# Patient Record
Sex: Female | Born: 1955 | Race: White | Hispanic: No | Marital: Married | Smoking: Never smoker
Health system: Southern US, Community
[De-identification: ages and names within clinical notes are randomized; demographics above are authoritative.]

## PROBLEM LIST (undated history)

## (undated) DIAGNOSIS — G35 Multiple sclerosis: Secondary | ICD-10-CM

## (undated) DIAGNOSIS — R569 Unspecified convulsions: Secondary | ICD-10-CM

## (undated) DIAGNOSIS — B019 Varicella without complication: Secondary | ICD-10-CM

## (undated) DIAGNOSIS — L719 Rosacea, unspecified: Secondary | ICD-10-CM

## (undated) DIAGNOSIS — M8580 Other specified disorders of bone density and structure, unspecified site: Secondary | ICD-10-CM

## (undated) DIAGNOSIS — M810 Age-related osteoporosis without current pathological fracture: Secondary | ICD-10-CM

## (undated) HISTORY — DX: Unspecified convulsions: R56.9

## (undated) HISTORY — DX: Multiple sclerosis: G35

## (undated) HISTORY — DX: Varicella without complication: B01.9

## (undated) HISTORY — PX: COLONOSCOPY: SHX174

## (undated) HISTORY — DX: Rosacea, unspecified: L71.9

---

## 2018-02-21 ENCOUNTER — Encounter: Payer: Self-pay | Admitting: Primary Care

## 2018-02-21 ENCOUNTER — Ambulatory Visit (INDEPENDENT_AMBULATORY_CARE_PROVIDER_SITE_OTHER): Payer: BLUE CROSS/BLUE SHIELD | Admitting: Primary Care

## 2018-02-21 ENCOUNTER — Encounter (INDEPENDENT_AMBULATORY_CARE_PROVIDER_SITE_OTHER): Payer: Self-pay

## 2018-02-21 VITALS — BP 122/82 | HR 77 | Temp 98.1°F | Ht 63.0 in | Wt 129.0 lb

## 2018-02-21 DIAGNOSIS — G40909 Epilepsy, unspecified, not intractable, without status epilepticus: Secondary | ICD-10-CM | POA: Diagnosis not present

## 2018-02-21 DIAGNOSIS — G35 Multiple sclerosis: Secondary | ICD-10-CM | POA: Diagnosis not present

## 2018-02-21 DIAGNOSIS — L719 Rosacea, unspecified: Secondary | ICD-10-CM

## 2018-02-21 DIAGNOSIS — G35D Multiple sclerosis, unspecified: Secondary | ICD-10-CM

## 2018-02-21 NOTE — Patient Instructions (Addendum)
Please schedule a physical with me in the future at your convenience. You may also schedule a lab only appointment 3-4 days prior. We will discuss your lab results in detail during your physical.  You will be contacted regarding your referral to ophthalmology and dermatology.  Please let us know if you have not been contacted within one week.   It was a pleasure to meet you today! Please don't hesitate to call or message me with any questions. Welcome to [facility]!

## 2018-02-21 NOTE — Assessment & Plan Note (Addendum)
Diagnosed in 1997, stable on current regimen. Will be establishing with neurology in September. Referral placed to ophthalmology for annual eye evaluation.

## 2018-02-21 NOTE — Assessment & Plan Note (Signed)
Compliant to [place]. No seizure since 2015.

## 2018-02-21 NOTE — Progress Notes (Signed)
Subjective:    Patient ID: Natasha George, female    DOB: 1956-03-06, 62 y.o.   MRN: [id number]  HPI  Ms. Proudfoot is a 62 year old female who presents today to establish care and discuss the problems mentioned below. Will obtain old records. Her last physical was in April 2018.   1) Seizure Disorder: Currently managed on Keppra 500 mg. Two seizures total: 10/30/09 and 08/15/13. No seizures since. She has an appointment scheduled in September with a neurologist.   2) Multiple Sclerosis: Currently managed on Avonex Pen 30 mcg/0.5 ml. Diagnosed in February 1997. She has an appointment scheduled in September with a neurologist.   BP Readings from Last 3 Encounters:  02/21/18 122/82     Review of Systems  Eyes: Negative for visual disturbance.  Respiratory: Negative for shortness of breath.   Cardiovascular: Negative for chest pain.  Neurological: Negative for seizures, weakness and numbness.       Past Medical History:  Diagnosis Date  . Chickenpox   . Multiple sclerosis ([place])   . Seizure [facility])      Social History   Socioeconomic History  . Marital status: Married    Spouse name: Not on file  . Number of children: Not on file  . Years of education: Not on file  . Highest education level: Not on file  Occupational History  . Not on file  Social Needs  . Financial resource strain: Not on file  . Food insecurity:    Worry: Not on file    Inability: Not on file  . Transportation needs:    Medical: Not on file    Non-medical: Not on file  Tobacco Use  . Smoking status: Never Smoker  . Smokeless tobacco: Never Used  Substance and Sexual Activity  . Alcohol use: Yes  . Drug use: Not on file  . Sexual activity: Not on file  Lifestyle  . Physical activity:    Days per week: Not on file    Minutes per session: Not on file  . Stress: Not on file  Relationships  . Social connections:    Talks on phone: Not on file    Gets together: Not on file    Attends  religious service: Not on file    Active member of club or organization: Not on file    Attends meetings of clubs or organizations: Not on file    Relationship status: Not on file  . Intimate partner violence:    Fear of current or ex partner: Not on file    Emotionally abused: Not on file    Physically abused: Not on file    Forced sexual activity: Not on file  Other Topics Concern  . Not on file  Social History Narrative  . Not on file    History reviewed. No pertinent surgical history.  Family History  Adopted: Yes    Allergies not on file  Current Outpatient Medications on File Prior to Visit  Medication Sig Dispense Refill  . AVONEX PEN 30 MCG/0.5ML AJKT Inject 30 mcg into the muscle once a week.     . Cholecalciferol (D-3-5) 5000 units capsule Take 5,000 Units by mouth daily.    Marland Kitchen levETIRAcetam (KEPPRA) 500 MG tablet Take 500 mg by mouth 2 (two) times daily.     . Multiple Vitamins-Minerals (MULTIVITAMIN WOMEN PO) Take by mouth.     No current facility-administered medications on file prior to visit.     BP 122/82  Pulse 77   Temp 98.1 F (36.7 C) (Oral)   Ht 5\' 3"  (1.6 m)   Wt 129 lb (58.5 kg)   SpO2 98%   BMI 22.85 kg/m    Objective:   Physical Exam  Constitutional: She appears well-nourished.  Neck: Neck supple.  Cardiovascular: Normal rate and regular rhythm.  Respiratory: Effort normal and breath sounds normal.  Skin: Skin is warm and dry.  Psychiatric: She has a normal mood and affect.           Assessment & Plan:

## 2018-02-21 NOTE — Assessment & Plan Note (Signed)
Requesting to see local dermatologist, referral placed.

## 2018-02-22 ENCOUNTER — Ambulatory Visit: Payer: BLUE CROSS/BLUE SHIELD | Admitting: Primary Care

## 2018-04-05 DIAGNOSIS — E559 Vitamin D deficiency, unspecified: Secondary | ICD-10-CM | POA: Insufficient documentation

## 2018-04-24 ENCOUNTER — Other Ambulatory Visit: Payer: Self-pay | Admitting: Primary Care

## 2018-04-24 DIAGNOSIS — Z0000 Encounter for general adult medical examination without abnormal findings: Secondary | ICD-10-CM

## 2018-04-29 ENCOUNTER — Other Ambulatory Visit (INDEPENDENT_AMBULATORY_CARE_PROVIDER_SITE_OTHER): Payer: BLUE CROSS/BLUE SHIELD

## 2018-04-29 DIAGNOSIS — R7989 Other specified abnormal findings of blood chemistry: Secondary | ICD-10-CM

## 2018-04-29 DIAGNOSIS — Z0000 Encounter for general adult medical examination without abnormal findings: Secondary | ICD-10-CM | POA: Diagnosis not present

## 2018-04-29 LAB — COMPREHENSIVE METABOLIC PANEL
ALT: 16 U/L (ref 0–35)
AST: 16 U/L (ref 0–37)
Albumin: 4.4 g/dL (ref 3.5–5.2)
Alkaline Phosphatase: 61 U/L (ref 39–117)
BUN: 18 mg/dL (ref 6–23)
CHLORIDE: 105 meq/L (ref 96–112)
CO2: 30 mEq/L (ref 19–32)
Calcium: 9.3 mg/dL (ref 8.4–10.5)
Creatinine, Ser: 0.66 mg/dL (ref 0.40–1.20)
GFR: 97.01 mL/min (ref >60.00)
GLUCOSE: 89 mg/dL (ref 70–99)
POTASSIUM: 4.6 meq/L (ref 3.5–5.1)
SODIUM: 141 meq/L (ref 135–145)
Total Bilirubin: 0.3 mg/dL (ref 0.2–1.2)
Total Protein: 7.1 g/dL (ref 6.0–8.3)

## 2018-04-29 LAB — HEMOGLOBIN A1C: HEMOGLOBIN A1C: 5.4 % (ref 4.6–6.5)

## 2018-04-29 LAB — LIPID PANEL
CHOLESTEROL: 219 mg/dL — AB (ref 0–200)
HDL: 76.9 mg/dL (ref >39.00)
LDL CALC: 126 mg/dL — AB (ref 0–99)
NONHDL: 142.03
Total CHOL/HDL Ratio: 3
Triglycerides: 81 mg/dL (ref 0.0–149.0)
VLDL: 16.2 mg/dL (ref 0.0–40.0)

## 2018-04-29 LAB — TSH: TSH: 3.4 u[IU]/mL (ref 0.35–4.50)

## 2018-04-29 NOTE — Addendum Note (Signed)
Addended by: Ellamae Sia on: 04/29/2018 10:04 AM   Modules accepted: Orders

## 2018-05-01 ENCOUNTER — Other Ambulatory Visit: Payer: Self-pay | Admitting: Primary Care

## 2018-05-01 ENCOUNTER — Encounter: Payer: Self-pay | Admitting: Primary Care

## 2018-05-01 ENCOUNTER — Ambulatory Visit (INDEPENDENT_AMBULATORY_CARE_PROVIDER_SITE_OTHER): Payer: BLUE CROSS/BLUE SHIELD | Admitting: Primary Care

## 2018-05-01 ENCOUNTER — Ambulatory Visit (INDEPENDENT_AMBULATORY_CARE_PROVIDER_SITE_OTHER)
Admission: RE | Admit: 2018-05-01 | Discharge: 2018-05-01 | Disposition: A | Discharge status: Home / Self Care | Payer: BLUE CROSS/BLUE SHIELD | Source: Ambulatory Visit | Attending: Primary Care | Admitting: Primary Care

## 2018-05-01 VITALS — BP 124/78 | HR 79 | Temp 98.0°F | Ht 63.0 in | Wt 130.5 lb

## 2018-05-01 DIAGNOSIS — G35 Multiple sclerosis: Secondary | ICD-10-CM

## 2018-05-01 DIAGNOSIS — Z1239 Encounter for other screening for malignant neoplasm of breast: Secondary | ICD-10-CM

## 2018-05-01 DIAGNOSIS — G40909 Epilepsy, unspecified, not intractable, without status epilepticus: Secondary | ICD-10-CM | POA: Diagnosis not present

## 2018-05-01 DIAGNOSIS — G8929 Other chronic pain: Secondary | ICD-10-CM

## 2018-05-01 DIAGNOSIS — M25552 Pain in left hip: Secondary | ICD-10-CM

## 2018-05-01 DIAGNOSIS — L719 Rosacea, unspecified: Secondary | ICD-10-CM | POA: Diagnosis not present

## 2018-05-01 DIAGNOSIS — Z23 Encounter for immunization: Secondary | ICD-10-CM | POA: Diagnosis not present

## 2018-05-01 DIAGNOSIS — Z0000 Encounter for general adult medical examination without abnormal findings: Secondary | ICD-10-CM | POA: Diagnosis not present

## 2018-05-01 MED ORDER — ZOSTER VAC RECOMB ADJUVANTED 50 MCG/0.5ML IM SUSR: 0.5000 mL | Freq: Once | INTRAMUSCULAR | 1 refills | Status: AC

## 2018-05-01 NOTE — Assessment & Plan Note (Signed)
Td UTD, influenza vaccination provided today. Declines Shingles vaccination. Pap smear UTD. Mammogram due in December. Colonoscopy UTD. Commended her on a healthy lifestyle, discussed to start resistance training. Exam unremarkable. Labs reviewed. Follow up in 1 year for CPE

## 2018-05-01 NOTE — Assessment & Plan Note (Signed)
Following with neurology and is doing well. Continue Avonex and Keppra.

## 2018-05-01 NOTE — Progress Notes (Signed)
Subjective:    Patient ID: Natasha George, female    DOB: 04-17-1956, 62 y.o.   MRN: [id number]  HPI  Natasha George is a 62 year old female who presents today for complete physical.  Immunizations: -Tetanus: Completed in 2012 -Influenza: Due today -Shingles: She will think about it.  Diet: She endorses a fair diet. Breakfast: Cereal Lunch: Salad Dinner: Protein, vegetables Snacks: None Desserts: None Beverages: Water, wine, coffee  Exercise: She is walking 30 minutes 5 days weekly Eye exam: Completed in August 2019 Dental exam: Completed in 2018 Colonoscopy: Completed in 2018 Pap Smear: Completed in 2018 Mammogram: Completed in December 2018    Review of Systems  Constitutional: Negative for unexpected weight change.  HENT: Negative for rhinorrhea.   Respiratory: Negative for cough and shortness of breath.   Cardiovascular: Negative for chest pain.  Gastrointestinal: Negative for constipation and diarrhea.  Genitourinary: Negative for difficulty urinating.  Musculoskeletal: Positive for arthralgias. Negative for myalgias.       Chronic left hip pain, that is noticeable when getting up from a seated positive. She denies radiation of pain down her left lower extremity, denies numbness/tingling  Skin: Negative for rash.  Allergic/Immunologic: Negative for environmental allergies.  Neurological: Negative for dizziness, numbness and headaches.       Past Medical History:  Diagnosis Date  . Chickenpox   . Multiple sclerosis ([place])   . Rosacea   . Seizure [facility])      Social History   Socioeconomic History  . Marital status: Married    Spouse name: Not on file  . Number of children: Not on file  . Years of education: Not on file  . Highest education level: Not on file  Occupational History  . Not on file  Social Needs  . Financial resource strain: Not on file  . Food insecurity:    Worry: Not on file    Inability: Not on file  . Transportation needs:   Medical: Not on file    Non-medical: Not on file  Tobacco Use  . Smoking status: Never Smoker  . Smokeless tobacco: Never Used  Substance and Sexual Activity  . Alcohol use: Yes  . Drug use: Not on file  . Sexual activity: Not on file  Lifestyle  . Physical activity:    Days per week: Not on file    Minutes per session: Not on file  . Stress: Not on file  Relationships  . Social connections:    Talks on phone: Not on file    Gets together: Not on file    Attends religious service: Not on file    Active member of club or organization: Not on file    Attends meetings of clubs or organizations: Not on file    Relationship status: Not on file  . Intimate partner violence:    Fear of current or ex partner: Not on file    Emotionally abused: Not on file    Physically abused: Not on file    Forced sexual activity: Not on file  Other Topics Concern  . Not on file  Social History Narrative  . Not on file    No past surgical history on file.  Family History  Adopted: Yes    No Known Allergies  Current Outpatient Medications on File Prior to Visit  Medication Sig Dispense Refill  . AVONEX PEN 30 MCG/0.5ML AJKT Inject 30 mcg into the muscle once a week.     . Cholecalciferol (  D-3-5) 5000 units capsule Take 5,000 Units by mouth daily.    Marland Kitchen levETIRAcetam (KEPPRA) 500 MG tablet Taking 500 mg by mouth in the morning and 1000 mg in the evening    . metroNIDAZOLE (METROGEL) 0.75 % gel APPLY ON THE FACE ONCE TO TWICE DAILY AS DIRECTED FOR ROSACEA  3  . Multiple Vitamins-Minerals (MULTIVITAMIN WOMEN PO) Take by mouth.     No current facility-administered medications on file prior to visit.     BP 124/78   Pulse 79   Temp 98 F (36.7 C) (Oral)   Ht 5\' 3"  (1.6 m)   Wt 130 lb 8 oz (59.2 kg)   SpO2 98%   BMI 23.12 kg/m    Objective:   Physical Exam  Constitutional: She is oriented to person, place, and time. She appears well-nourished.  HENT:  Mouth/Throat: No oropharyngeal  exudate.  Eyes: Pupils are equal, round, and reactive to light. EOM are normal.  Neck: Neck supple. No thyromegaly present.  Cardiovascular: Normal rate and regular rhythm.  Respiratory: Effort normal and breath sounds normal.  GI: Soft. Bowel sounds are normal. There is no tenderness.  Musculoskeletal: Normal range of motion.       Left hip: She exhibits normal range of motion, normal strength and no tenderness.  Neurological: She is alert and oriented to person, place, and time.  Skin: Skin is warm and dry.  Psychiatric: She has a normal mood and affect.           Assessment & Plan:

## 2018-05-01 NOTE — Assessment & Plan Note (Signed)
Following with dermatology, managed on metronidazole gel. Continue same.

## 2018-05-01 NOTE — Assessment & Plan Note (Signed)
Stable, no seizure activity. Continue Keppra. Following with Neurology.

## 2018-05-01 NOTE — Addendum Note (Signed)
Addended by: Jacqualin Combes on: 05/01/2018 01:53 PM   Modules accepted: Orders

## 2018-05-01 NOTE — Patient Instructions (Signed)
Complete xray(s) prior to leaving today. I will notify you of your results once received.  Continue exercising. You should be getting 150 minutes of moderate intensity exercise weekly. Make sure to also stretch to reduce arthritis pain.   Continue to work on [employer]. Ensure you are consuming 64 ounces of water daily.  It was a pleasure to see you today!

## 2018-05-01 NOTE — Assessment & Plan Note (Signed)
Present for the last one year, mostly when changing positions or getting up from resting. Consider osteoarthritis. Check plain films today.

## 2018-07-08 ENCOUNTER — Ambulatory Visit
Admission: RE | Admit: 2018-07-08 | Discharge: 2018-07-08 | Disposition: A | Discharge status: Home / Self Care | Payer: BLUE CROSS/BLUE SHIELD | Source: Ambulatory Visit | Attending: Primary Care | Admitting: Primary Care

## 2018-07-08 DIAGNOSIS — Z1239 Encounter for other screening for malignant neoplasm of breast: Secondary | ICD-10-CM | POA: Insufficient documentation

## 2018-08-13 ENCOUNTER — Ambulatory Visit (INDEPENDENT_AMBULATORY_CARE_PROVIDER_SITE_OTHER): Payer: BLUE CROSS/BLUE SHIELD | Admitting: Primary Care

## 2018-08-13 ENCOUNTER — Ambulatory Visit (INDEPENDENT_AMBULATORY_CARE_PROVIDER_SITE_OTHER)
Admission: RE | Admit: 2018-08-13 | Discharge: 2018-08-13 | Disposition: A | Discharge status: Home / Self Care | Payer: BLUE CROSS/BLUE SHIELD | Source: Ambulatory Visit | Attending: Primary Care | Admitting: Primary Care

## 2018-08-13 VITALS — BP 122/78 | HR 66 | Temp 98.0°F | Ht 63.0 in | Wt 135.2 lb

## 2018-08-13 DIAGNOSIS — M25552 Pain in left hip: Secondary | ICD-10-CM

## 2018-08-13 DIAGNOSIS — G8929 Other chronic pain: Secondary | ICD-10-CM

## 2018-08-13 NOTE — Patient Instructions (Signed)
Complete xray(s) prior to leaving today. I will notify you of your results once received.  You will be contacted regarding your referral to orthopedics.  Please let us know if you have not been contacted within one week.   It was a pleasure to see you today!

## 2018-08-13 NOTE — Progress Notes (Signed)
Subjective:    Patient ID: Natasha George, female    DOB: 01-Aug-1955, 63 y.o.   MRN: [id number]  HPI  Natasha George is a 63 year old female with a history of chronic hip pain and multiple sclerosis who presents today with a chief complaint of hip pain.  She was last evaluated in mid October 2019 for her CPE, endorsed left chronic hip pain that was most noticeable when changing positions from a seated position. No radiculopathy or back pain. She underwent xrays of the left hip which was unremarkable for the hip, degeneration to lumbar spine. She was referred to physical therapy for further treatment.  Since her last visit she's completed physical therapy and doesn't feel any better. She's mostly concerned about a "gimp" to the left lower extremity from the hip. She does have some achy.  She walks one mile daily, continues with PT exercises at home. Her left hip pain continues to interact with her ADL's. Will notice decreased ability with rising from picking up an object from the floor.   She denies back pain, weakness, changes in bowel/bladder function, numbness/tingling.   Review of Systems  Musculoskeletal: Positive for arthralgias. Negative for back pain.       Left joint stiffness to hip  Neurological: Negative for weakness and numbness.       Past Medical History:  Diagnosis Date  . Chickenpox   . Multiple sclerosis ([place])   . Rosacea   . Seizure [facility])      Social History   Socioeconomic History  . Marital status: Married    Spouse name: Not on file  . Number of children: Not on file  . Years of education: Not on file  . Highest education level: Not on file  Occupational History  . Not on file  Social Needs  . Financial resource strain: Not on file  . Food insecurity:    Worry: Not on file    Inability: Not on file  . Transportation needs:    Medical: Not on file    Non-medical: Not on file  Tobacco Use  . Smoking status: Never Smoker  . Smokeless tobacco:  Never Used  Substance and Sexual Activity  . Alcohol use: Yes  . Drug use: Not on file  . Sexual activity: Not on file  Lifestyle  . Physical activity:    Days per week: Not on file    Minutes per session: Not on file  . Stress: Not on file  Relationships  . Social connections:    Talks on phone: Not on file    Gets together: Not on file    Attends religious service: Not on file    Active member of club or organization: Not on file    Attends meetings of clubs or organizations: Not on file    Relationship status: Not on file  . Intimate partner violence:    Fear of current or ex partner: Not on file    Emotionally abused: Not on file    Physically abused: Not on file    Forced sexual activity: Not on file  Other Topics Concern  . Not on file  Social History Narrative  . Not on file    No past surgical history on file.  Family History  Adopted: Yes  Problem Relation Age of Onset  . Breast cancer Neg Hx     No Known Allergies  Current Outpatient Medications on File Prior to Visit  Medication Sig Dispense Refill  .  AVONEX PEN 30 MCG/0.5ML AJKT Inject 30 mcg into the muscle once a week.     . Cholecalciferol (D-3-5) 5000 units capsule Take 5,000 Units by mouth daily.    Marland Kitchen KERYDIN 5 % SOLN Apply 1 drop topically daily.    Marland Kitchen levETIRAcetam (KEPPRA) 500 MG tablet Taking 500 mg by mouth in the morning and 1000 mg in the evening    . metroNIDAZOLE (METROGEL) 0.75 % gel Apply 1 application topically daily.   3  . Multiple Vitamins-Minerals (MULTIVITAMIN WOMEN PO) Take by mouth.    . SOOLANTRA 1 % CREA Apply 1 application topically 2 (two) times daily.     No current facility-administered medications on file prior to visit.     BP 122/78   Pulse 66   Temp 98 F (36.7 C) (Oral)   Ht 5\' 3"  (1.6 m)   Wt 135 lb 4 oz (61.3 kg)   SpO2 98%   BMI 23.96 kg/m    Objective:   Physical Exam  Cardiovascular: Normal rate.  Respiratory: Effort normal.  Musculoskeletal: Normal  range of motion.     Left hip: She exhibits normal range of motion and normal strength.     Lumbar back: She exhibits normal range of motion, no tenderness and no pain.     Comments: 5/5 strength to bilateral lower extremities. Normal ROM to bilateral hips while lying supine including flexion, extension, rotation.   Skin: Skin is warm and dry. No erythema.           Assessment & Plan:

## 2018-08-14 NOTE — Assessment & Plan Note (Signed)
Continued despite physical therapy and home exercises. Plain films of the left hip overall unremarkable. Check plain films of the lumbar spine today to ensure no involvement. Given no improvement, will send to orthopedics for further evaluation and treatment.

## 2018-11-01 DIAGNOSIS — M5416 Radiculopathy, lumbar region: Secondary | ICD-10-CM | POA: Insufficient documentation

## 2019-02-25 ENCOUNTER — Telehealth: Payer: Self-pay | Admitting: *Deleted

## 2019-02-25 NOTE — Telephone Encounter (Signed)
Patient called stating that her neurologist Dr. Janeth Rase in [place] has ordered her 3 days of steroids IV. Patient was calling wanting to know if our office did this and would like that set up. Advised patient that our office does not do anything like that. Patient stated that her doctor told her to contact Allie Bossier NP to see about getting it done locally so it would be more convenient for her. Patient stated that she will just call her neurologist back and advise him that our office does not do procedures like that.

## 2019-02-25 NOTE — Telephone Encounter (Signed)
Noted and agree, we do not do IV steroids in our office. She will need to speak with her neurologist.

## 2019-04-25 ENCOUNTER — Other Ambulatory Visit: Payer: Self-pay | Admitting: Primary Care

## 2019-04-25 DIAGNOSIS — Z0000 Encounter for general adult medical examination without abnormal findings: Secondary | ICD-10-CM

## 2019-05-01 ENCOUNTER — Other Ambulatory Visit: Payer: Self-pay

## 2019-05-01 ENCOUNTER — Other Ambulatory Visit (INDEPENDENT_AMBULATORY_CARE_PROVIDER_SITE_OTHER): Payer: BC Managed Care – PPO

## 2019-05-01 DIAGNOSIS — Z0000 Encounter for general adult medical examination without abnormal findings: Secondary | ICD-10-CM

## 2019-05-01 LAB — LIPID PANEL
Cholesterol: 209 mg/dL — ABNORMAL HIGH (ref 0–200)
HDL: 77.3 mg/dL (ref >39.00)
LDL Cholesterol: 120 mg/dL — ABNORMAL HIGH (ref 0–99)
NonHDL: 131.4
Total CHOL/HDL Ratio: 3
Triglycerides: 55 mg/dL (ref 0.0–149.0)
VLDL: 11 mg/dL (ref 0.0–40.0)

## 2019-05-01 LAB — COMPREHENSIVE METABOLIC PANEL
ALT: 58 U/L — ABNORMAL HIGH (ref 0–35)
AST: 43 U/L — ABNORMAL HIGH (ref 0–37)
Albumin: 4.4 g/dL (ref 3.5–5.2)
Alkaline Phosphatase: 64 U/L (ref 39–117)
BUN: 14 mg/dL (ref 6–23)
CO2: 28 mEq/L (ref 19–32)
Calcium: 9.2 mg/dL (ref 8.4–10.5)
Chloride: 104 mEq/L (ref 96–112)
Creatinine, Ser: 0.56 mg/dL (ref 0.40–1.20)
GFR: 109.96 mL/min (ref >60.00)
Glucose, Bld: 97 mg/dL (ref 70–99)
Potassium: 5.2 mEq/L — ABNORMAL HIGH (ref 3.5–5.1)
Sodium: 138 mEq/L (ref 135–145)
Total Bilirubin: 0.6 mg/dL (ref 0.2–1.2)
Total Protein: 6.5 g/dL (ref 6.0–8.3)

## 2019-05-01 LAB — CBC
HCT: 40.8 % (ref 36.0–46.0)
Hemoglobin: 13.6 g/dL (ref 12.0–15.0)
MCHC: 33.4 g/dL (ref 30.0–36.0)
MCV: 96.3 fl (ref 78.0–100.0)
Platelets: 278 10*3/uL (ref 150.0–400.0)
RBC: 4.23 Mil/uL (ref 3.87–5.11)
RDW: 14.1 % (ref 11.5–15.5)
WBC: 3.8 10*3/uL — ABNORMAL LOW (ref 4.0–10.5)

## 2019-05-06 ENCOUNTER — Ambulatory Visit (INDEPENDENT_AMBULATORY_CARE_PROVIDER_SITE_OTHER): Payer: BC Managed Care – PPO | Admitting: Primary Care

## 2019-05-06 ENCOUNTER — Other Ambulatory Visit: Payer: Self-pay

## 2019-05-06 ENCOUNTER — Encounter: Payer: Self-pay | Admitting: *Deleted

## 2019-05-06 ENCOUNTER — Encounter: Payer: Self-pay | Admitting: Primary Care

## 2019-05-06 VITALS — BP 124/80 | HR 88 | Temp 97.8°F | Ht 63.0 in | Wt 128.0 lb

## 2019-05-06 DIAGNOSIS — M25552 Pain in left hip: Secondary | ICD-10-CM | POA: Diagnosis not present

## 2019-05-06 DIAGNOSIS — L719 Rosacea, unspecified: Secondary | ICD-10-CM

## 2019-05-06 DIAGNOSIS — G35 Multiple sclerosis: Secondary | ICD-10-CM | POA: Diagnosis not present

## 2019-05-06 DIAGNOSIS — Z1231 Encounter for screening mammogram for malignant neoplasm of breast: Secondary | ICD-10-CM

## 2019-05-06 DIAGNOSIS — Z0000 Encounter for general adult medical examination without abnormal findings: Secondary | ICD-10-CM

## 2019-05-06 DIAGNOSIS — R7989 Other specified abnormal findings of blood chemistry: Secondary | ICD-10-CM

## 2019-05-06 DIAGNOSIS — Z23 Encounter for immunization: Secondary | ICD-10-CM

## 2019-05-06 DIAGNOSIS — G8929 Other chronic pain: Secondary | ICD-10-CM

## 2019-05-06 DIAGNOSIS — E785 Hyperlipidemia, unspecified: Secondary | ICD-10-CM

## 2019-05-06 NOTE — Assessment & Plan Note (Addendum)
Followed with orthopedics, underwent MRI spine and EMG testing. 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 Now following with PT who told her that it was her gait, continues to follow PT through neurology and is doing better.

## 2019-05-06 NOTE — Progress Notes (Signed)
Subjective:    Patient ID: Natasha George, female    DOB: [birth year], 63 y.o.   MRN: [id number]  HPI  Natasha George is a 63 year old female who presents today for complete physical.  Immunizations: -Tetanus: Completed in 2012 -Influenza: Due today -Shingles: Never completed, declines   Diet: She endorses a healthy diet. Eating plenty of salads, vegetables, little protein. Desserts infrequently. Drinking mostly water Exercise: She is active daily.  Eye exam: Completed in 2019, due again in December 2020 Dental exam: No recent exam Colonoscopy: Completed in 2018, due again in 2023 Pap Smear: Completed in 2018 Mammogram: Negative in 2019  BP Readings from Last 3 Encounters:  05/06/19 124/80  08/13/18 122/78  05/01/18 124/78      Review of Systems  Constitutional: Negative for unexpected weight change.  HENT: Negative for rhinorrhea.   Respiratory: Negative for cough and shortness of breath.   Cardiovascular: Negative for chest pain.  Gastrointestinal: Negative for constipation and diarrhea.  Genitourinary: Negative for difficulty urinating.  Musculoskeletal: Positive for arthralgias.       Intermittent right dull ache to bilateral lower extremity.  Skin: Negative for rash.  Allergic/Immunologic: Negative for environmental allergies.  Neurological: Negative for dizziness, numbness and headaches.  Psychiatric/Behavioral: The patient is not nervous/anxious.        Past Medical History:  Diagnosis Date  . Chickenpox   . Multiple sclerosis ([place])   . Rosacea   . Seizure [facility])      Social History   Socioeconomic History  . Marital status: Married    Spouse name: Not on file  . Number of children: Not on file  . Years of education: Not on file  . Highest education level: Not on file  Occupational History  . Not on file  Social Needs  . Financial resource strain: Not on file  . Food insecurity    Worry: Not on file    Inability: Not on file  .  Transportation needs    Medical: Not on file    Non-medical: Not on file  Tobacco Use  . Smoking status: Never Smoker  . Smokeless tobacco: Never Used  Substance and Sexual Activity  . Alcohol use: Yes  . Drug use: Not on file  . Sexual activity: Not on file  Lifestyle  . Physical activity    Days per week: Not on file    Minutes per session: Not on file  . Stress: Not on file  Relationships  . Social Herbalist on phone: Not on file    Gets together: Not on file    Attends religious service: Not on file    Active member of club or organization: Not on file    Attends meetings of clubs or organizations: Not on file    Relationship status: Not on file  . Intimate partner violence    Fear of current or ex partner: Not on file    Emotionally abused: Not on file    Physically abused: Not on file    Forced sexual activity: Not on file  Other Topics Concern  . Not on file  Social History Narrative  . Not on file    No past surgical history on file.  Family History  Adopted: Yes  Problem Relation Age of Onset  . Breast cancer Neg Hx     No Known Allergies  Current Outpatient Medications on File Prior to Visit  Medication Sig Dispense Refill  . Cholecalciferol (VITAMIN  D3) 1.25 MG (50000 UT) CAPS TAKE 1 CAPSULE BY MOUTH ONCE A WEEKLY X 8 WEEKS THEN SWITCH TO OTC 4,000 UNITS DAILY    . Ivermectin (SOOLANTRA) 1 % CREA Soolantra 1 % topical cream    . levETIRAcetam (KEPPRA) 500 MG tablet Taking 500 mg by mouth in the morning and 1000 mg in the evening    . metroNIDAZOLE (METROGEL) 0.75 % gel Apply 1 application topically daily.   3  . Multiple Vitamins-Minerals (MULTIVITAMIN WOMEN PO) Take by mouth.    . SOOLANTRA 1 % CREA Apply 1 application topically 2 (two) times daily.    . Tavaborole (KERYDIN) 5 % SOLN Kerydin 5 % topical solution with applicator  APPLY TOPICALLY TO THE AFFECTED AREA EVERY NIGHT AT BEDTIME    . VUMERITY 231 MG CPDR Take by mouth. Taking 2  tablets by mouth in the am and pm     No current facility-administered medications on file prior to visit.     BP 124/80   Pulse 88   Temp 97.8 F (36.6 C) (Temporal)   Ht 5\' 3"  (1.6 m)   Wt 128 lb (58.1 kg)   SpO2 100%   BMI 22.67 kg/m    Objective:   Physical Exam  Constitutional: She is oriented to person, place, and time. She appears well-nourished.  HENT:  Right Ear: Tympanic membrane and ear canal normal.  Left Ear: Tympanic membrane and ear canal normal.  Mouth/Throat: Oropharynx is clear and moist.  Eyes: Pupils are equal, round, and reactive to light. EOM are normal.  Neck: Neck supple.  Cardiovascular: Normal rate and regular rhythm.  Respiratory: Effort normal and breath sounds normal.  GI: Soft. Bowel sounds are normal. There is no abdominal tenderness.  Musculoskeletal: Normal range of motion.  Neurological: She is alert and oriented to person, place, and time.  Skin: Skin is warm and dry.  Psychiatric: She has a normal mood and affect.           Assessment & Plan:

## 2019-05-06 NOTE — Assessment & Plan Note (Signed)
Tetanus UTD, declines Shingrix. Influenza provided today. Pap smear UTD, due in 2021. Colonoscopy UTD, due in 2023. Mammogram due in December, ordered. Commended her on a healthy diet and active lifestyle. Exam today unremarkable. Labs reviewed.

## 2019-05-06 NOTE — Assessment & Plan Note (Signed)
Improved compared to prior labs. Likely genetic given her active lifestyle and healthy diet. Continue to monitor.

## 2019-05-06 NOTE — Assessment & Plan Note (Signed)
Overall improving, compliant to treatment. Following with dermatology.

## 2019-05-06 NOTE — Addendum Note (Signed)
Addended by: Jacqualin Combes on: 05/06/2019 01:42 PM   Modules accepted: Orders

## 2019-05-06 NOTE — Assessment & Plan Note (Signed)
New. Likely secondary to Vumerity, neurology is following. Repeat labs in 2 weeks per neurology.

## 2019-05-06 NOTE — Patient Instructions (Signed)
Continue exercising. You should be getting 150 minutes of moderate intensity exercise weekly.  Continue to eat a healthy diet. Ensure you are consuming 64 ounces of water daily.  Call the breast center for your mammogram appointment.  It was a pleasure to see you today!   Preventive Care 40-60 Years Old, Female Preventive care refers to visits with your health care provider and lifestyle choices that can promote health and wellness. This includes:  A yearly physical exam. This may also be called an annual well check.  Regular dental visits and eye exams.  Immunizations.  Screening for certain conditions.  Healthy lifestyle choices, such as eating a healthy diet, getting regular exercise, not using drugs or products that contain nicotine and tobacco, and limiting alcohol use. What can I expect for my preventive care visit? Physical exam Your health care provider will check your:  Height and weight. This may be used to calculate body mass index (BMI), which tells if you are at a healthy weight.  Heart rate and blood pressure.  Skin for abnormal spots. Counseling Your health care provider may ask you questions about your:  Alcohol, tobacco, and drug use.  Emotional well-being.  Home and relationship well-being.  Sexual activity.  Eating habits.  Work and work Statistician.  Method of birth control.  Menstrual cycle.  Pregnancy history. What immunizations do I need?  Influenza (flu) vaccine  This is recommended every year. Tetanus, diphtheria, and pertussis (Tdap) vaccine  You may need a Td booster every 10 years. Varicella (chickenpox) vaccine  You may need this if you have not been vaccinated. Zoster (shingles) vaccine  You may need this after age 59. Measles, mumps, and rubella (MMR) vaccine  You may need at least one dose of MMR if you were born in 1957 or later. You may also need a second dose. Pneumococcal conjugate (PCV13) vaccine  You may need  this if you have certain conditions and were not previously vaccinated. Pneumococcal polysaccharide (PPSV23) vaccine  You may need one or two doses if you smoke cigarettes or if you have certain conditions. Meningococcal conjugate (MenACWY) vaccine  You may need this if you have certain conditions. Hepatitis A vaccine  You may need this if you have certain conditions or if you travel or work in places where you may be exposed to hepatitis A. Hepatitis B vaccine  You may need this if you have certain conditions or if you travel or work in places where you may be exposed to hepatitis B. Haemophilus influenzae type b (Hib) vaccine  You may need this if you have certain conditions. Human papillomavirus (HPV) vaccine  If recommended by your health care provider, you may need three doses over 6 months. You may receive vaccines as individual doses or as more than one vaccine together in one shot (combination vaccines). Talk with your health care provider about the risks and benefits of combination vaccines. What tests do I need? Blood tests  Lipid and cholesterol levels. These may be checked every 5 years, or more frequently if you are over 69 years old.  Hepatitis C test.  Hepatitis B test. Screening  Lung cancer screening. You may have this screening every year starting at age 26 if you have a 30-pack-year history of smoking and currently smoke or have quit within the past 15 years.  Colorectal cancer screening. All adults should have this screening starting at age 85 and continuing until age 57. Your health care provider may recommend screening at age  45 if you are at increased risk. You will have tests every 1-10 years, depending on your results and the type of screening test.  Diabetes screening. This is done by checking your blood sugar (glucose) after you have not eaten for a while (fasting). You may have this done every 1-3 years.  Mammogram. This may be done every 1-2 years.  Talk with your health care provider about when you should start having regular mammograms. This may depend on whether you have a family history of breast cancer.  BRCA-related cancer screening. This may be done if you have a family history of breast, ovarian, tubal, or peritoneal cancers.  Pelvic exam and Pap test. This may be done every 3 years starting at age 76. Starting at age 28, this may be done every 5 years if you have a Pap test in combination with an HPV test. Other tests  Sexually transmitted disease (STD) testing.  Bone density scan. This is done to screen for osteoporosis. You may have this scan if you are at high risk for osteoporosis. Follow these instructions at home: Eating and drinking  Eat a diet that includes fresh fruits and vegetables, whole grains, lean protein, and low-fat dairy.  Take vitamin and mineral supplements as recommended by your health care provider.  Do not drink alcohol if: ? Your health care provider tells you not to drink. ? You are pregnant, may be pregnant, or are planning to become pregnant.  If you drink alcohol: ? Limit how much you have to 0-1 drink a day. ? Be aware of how much alcohol is in your drink. In the [place] one drink equals one 12 oz bottle of beer (355 mL), one 5 oz glass of wine (148 mL), or one 1 oz glass of hard liquor (44 mL). Lifestyle  Take daily care of your teeth and gums.  Stay active. Exercise for at least 30 minutes on 5 or more days each week.  Do not use any products that contain nicotine or tobacco, such as cigarettes, e-cigarettes, and chewing tobacco. If you need help quitting, ask your health care provider.  If you are sexually active, practice safe sex. Use a condom or other form of birth control (contraception) in order to prevent pregnancy and STIs (sexually transmitted infections).  If told by your health care provider, take low-dose aspirin daily starting at age 73. What's next?  Visit your health care  provider once a year for a well check visit.  Ask your health care provider how often you should have your eyes and teeth checked.  Stay up to date on all vaccines. This information is not intended to replace advice given to you by your health care provider. Make sure you discuss any questions you have with your health care provider. Document Released: 07/30/2015 Document Revised: 03/14/2018 Document Reviewed: 03/14/2018 Elsevier Patient Education  2020 [employer].

## 2019-05-06 NOTE — Assessment & Plan Note (Addendum)
Following with neurology through [facility], recently underwent MRI of the head and neck, mild lesion.   Now on Vumerity and is doing well. Due for repeat labs in a few weeks.  No recent seizures, compliant to [place].

## 2019-07-04 DIAGNOSIS — M6281 Muscle weakness (generalized): Secondary | ICD-10-CM | POA: Insufficient documentation

## 2019-07-14 ENCOUNTER — Ambulatory Visit
Admission: RE | Admit: 2019-07-14 | Discharge: 2019-07-14 | Disposition: A | Discharge status: Home / Self Care | Payer: BC Managed Care – PPO | Source: Ambulatory Visit | Attending: Primary Care | Admitting: Primary Care

## 2019-07-14 DIAGNOSIS — Z1231 Encounter for screening mammogram for malignant neoplasm of breast: Secondary | ICD-10-CM | POA: Diagnosis not present

## 2019-08-26 ENCOUNTER — Ambulatory Visit
Admission: RE | Admit: 2019-08-26 | Discharge: 2019-08-26 | Disposition: A | Discharge status: Home / Self Care | Payer: BC Managed Care – PPO | Source: Ambulatory Visit | Attending: Family | Admitting: Family

## 2019-08-26 ENCOUNTER — Other Ambulatory Visit: Payer: Self-pay | Admitting: Family

## 2019-08-26 DIAGNOSIS — M79672 Pain in left foot: Secondary | ICD-10-CM

## 2019-09-20 ENCOUNTER — Other Ambulatory Visit: Payer: Self-pay | Admitting: Primary Care

## 2019-09-20 DIAGNOSIS — Z1152 Encounter for screening for COVID-19: Secondary | ICD-10-CM

## 2019-09-22 ENCOUNTER — Other Ambulatory Visit: Payer: Self-pay

## 2019-09-22 ENCOUNTER — Other Ambulatory Visit (INDEPENDENT_AMBULATORY_CARE_PROVIDER_SITE_OTHER): Payer: BC Managed Care – PPO

## 2019-09-22 DIAGNOSIS — Z1152 Encounter for screening for COVID-19: Secondary | ICD-10-CM | POA: Diagnosis not present

## 2019-09-22 LAB — SARS-COV-2 IGG: SARS-COV-2 IgG: 2.63

## 2019-09-24 ENCOUNTER — Encounter: Payer: Self-pay | Admitting: *Deleted

## 2019-10-10 ENCOUNTER — Ambulatory Visit: Payer: BC Managed Care – PPO | Attending: Internal Medicine

## 2019-10-10 DIAGNOSIS — Z23 Encounter for immunization: Secondary | ICD-10-CM

## 2019-10-10 NOTE — Progress Notes (Signed)
   Covid-19 Vaccination Clinic  Name:  Natasha George    MRN: [id number] DOB: Apr 23, 1956  10/10/2019  [name] was observed post Covid-19 immunization for 15 minutes without incident. She was provided with Vaccine Information Sheet and instruction to access the V-Safe system.   [name] was instructed to call 911 with any severe reactions post vaccine: Marland Kitchen Difficulty breathing  . Swelling of face and throat  . A fast heartbeat  . A bad rash all over body  . Dizziness and weakness   Immunizations Administered    Name Date Dose VIS Date Route   Pfizer COVID-19 Vaccine 10/10/2019  1:41 PM 0.3 mL 06/27/2019 Intramuscular   Manufacturer: [place]   Lot: [id number]   [place]: [id number]

## 2019-11-04 ENCOUNTER — Ambulatory Visit: Payer: BC Managed Care – PPO | Attending: Internal Medicine

## 2019-11-04 DIAGNOSIS — Z23 Encounter for immunization: Secondary | ICD-10-CM

## 2019-11-04 NOTE — Progress Notes (Signed)
   Covid-19 Vaccination Clinic  Name:  SAIDA HUCKABAY    MRN: [id number] DOB: [birth year]  11/04/2019  Ms. Kysar was observed post Covid-19 immunization for 15 minutes without incident. She was provided with Vaccine Information Sheet and instruction to access the V-Safe system.   Ms. Corner was instructed to call 911 with any severe reactions post vaccine: Marland Kitchen Difficulty breathing  . Swelling of face and throat  . A fast heartbeat  . A bad rash all over body  . Dizziness and weakness   Immunizations Administered    Name Date Dose VIS Date Route   Pfizer COVID-19 Vaccine 11/04/2019  9:27 AM 0.3 mL 09/10/2018 Intramuscular   Manufacturer: [place]   Lot: [id number]   [place]: [id number]

## 2019-12-24 ENCOUNTER — Ambulatory Visit: Payer: BC Managed Care – PPO | Admitting: Dermatology

## 2019-12-24 HISTORY — PX: GASTROCNEMIUS RECESSION: SUR1071

## 2020-01-08 DIAGNOSIS — M6240 Contracture of muscle, unspecified site: Secondary | ICD-10-CM | POA: Insufficient documentation

## 2020-03-25 DIAGNOSIS — R269 Unspecified abnormalities of gait and mobility: Secondary | ICD-10-CM | POA: Insufficient documentation

## 2020-04-19 ENCOUNTER — Other Ambulatory Visit: Payer: Self-pay | Admitting: Primary Care

## 2020-04-19 DIAGNOSIS — E785 Hyperlipidemia, unspecified: Secondary | ICD-10-CM

## 2020-04-19 DIAGNOSIS — Z114 Encounter for screening for human immunodeficiency virus [HIV]: Secondary | ICD-10-CM

## 2020-04-19 DIAGNOSIS — G35 Multiple sclerosis: Secondary | ICD-10-CM

## 2020-04-19 DIAGNOSIS — Z1159 Encounter for screening for other viral diseases: Secondary | ICD-10-CM

## 2020-04-29 ENCOUNTER — Other Ambulatory Visit: Payer: Self-pay

## 2020-04-29 ENCOUNTER — Other Ambulatory Visit (INDEPENDENT_AMBULATORY_CARE_PROVIDER_SITE_OTHER): Payer: BC Managed Care – PPO

## 2020-04-29 DIAGNOSIS — Z1159 Encounter for screening for other viral diseases: Secondary | ICD-10-CM

## 2020-04-29 DIAGNOSIS — E785 Hyperlipidemia, unspecified: Secondary | ICD-10-CM

## 2020-04-29 DIAGNOSIS — Z114 Encounter for screening for human immunodeficiency virus [HIV]: Secondary | ICD-10-CM

## 2020-04-29 LAB — COMPREHENSIVE METABOLIC PANEL
ALT: 17 U/L (ref 0–35)
AST: 18 U/L (ref 0–37)
Albumin: 4.6 g/dL (ref 3.5–5.2)
Alkaline Phosphatase: 51 U/L (ref 39–117)
BUN: 11 mg/dL (ref 6–23)
CO2: 30 mEq/L (ref 19–32)
Calcium: 9.4 mg/dL (ref 8.4–10.5)
Chloride: 101 mEq/L (ref 96–112)
Creatinine, Ser: 0.53 mg/dL (ref 0.40–1.20)
GFR: 101.57 mL/min (ref >60.00)
Glucose, Bld: 87 mg/dL (ref 70–99)
Potassium: 4.5 mEq/L (ref 3.5–5.1)
Sodium: 137 mEq/L (ref 135–145)
Total Bilirubin: 0.8 mg/dL (ref 0.2–1.2)
Total Protein: 7 g/dL (ref 6.0–8.3)

## 2020-04-29 LAB — CBC
HCT: 42.5 % (ref 36.0–46.0)
Hemoglobin: 14.5 g/dL (ref 12.0–15.0)
MCHC: 34.1 g/dL (ref 30.0–36.0)
MCV: 94.2 fl (ref 78.0–100.0)
Platelets: 261 10*3/uL (ref 150.0–400.0)
RBC: 4.51 Mil/uL (ref 3.87–5.11)
RDW: 13.4 % (ref 11.5–15.5)
WBC: 3 10*3/uL — ABNORMAL LOW (ref 4.0–10.5)

## 2020-04-29 LAB — LIPID PANEL
Cholesterol: 222 mg/dL — ABNORMAL HIGH (ref 0–200)
HDL: 97.7 mg/dL (ref >39.00)
LDL Cholesterol: 116 mg/dL — ABNORMAL HIGH (ref 0–99)
NonHDL: 123.81
Total CHOL/HDL Ratio: 2
Triglycerides: 37 mg/dL (ref 0.0–149.0)
VLDL: 7.4 mg/dL (ref 0.0–40.0)

## 2020-04-30 LAB — HEPATITIS C ANTIBODY
Hepatitis C Ab: NONREACTIVE
SIGNAL TO CUT-OFF: 0.01 (ref <1.00)

## 2020-04-30 LAB — HIV ANTIBODY (ROUTINE TESTING W REFLEX): HIV 1&2 Ab, 4th Generation: NONREACTIVE

## 2020-05-06 ENCOUNTER — Ambulatory Visit (INDEPENDENT_AMBULATORY_CARE_PROVIDER_SITE_OTHER): Payer: BC Managed Care – PPO | Admitting: Primary Care

## 2020-05-06 ENCOUNTER — Encounter: Payer: Self-pay | Admitting: Primary Care

## 2020-05-06 ENCOUNTER — Other Ambulatory Visit: Payer: Self-pay

## 2020-05-06 VITALS — BP 118/64 | HR 79 | Temp 98.2°F | Ht 63.0 in | Wt 124.0 lb

## 2020-05-06 DIAGNOSIS — Z23 Encounter for immunization: Secondary | ICD-10-CM

## 2020-05-06 DIAGNOSIS — M25552 Pain in left hip: Secondary | ICD-10-CM

## 2020-05-06 DIAGNOSIS — G40909 Epilepsy, unspecified, not intractable, without status epilepticus: Secondary | ICD-10-CM

## 2020-05-06 DIAGNOSIS — G8929 Other chronic pain: Secondary | ICD-10-CM

## 2020-05-06 DIAGNOSIS — R7989 Other specified abnormal findings of blood chemistry: Secondary | ICD-10-CM

## 2020-05-06 DIAGNOSIS — Z1211 Encounter for screening for malignant neoplasm of colon: Secondary | ICD-10-CM | POA: Diagnosis not present

## 2020-05-06 DIAGNOSIS — Z1231 Encounter for screening mammogram for malignant neoplasm of breast: Secondary | ICD-10-CM | POA: Diagnosis not present

## 2020-05-06 DIAGNOSIS — E785 Hyperlipidemia, unspecified: Secondary | ICD-10-CM

## 2020-05-06 DIAGNOSIS — Z0000 Encounter for general adult medical examination without abnormal findings: Secondary | ICD-10-CM | POA: Diagnosis not present

## 2020-05-06 DIAGNOSIS — G35 Multiple sclerosis: Secondary | ICD-10-CM

## 2020-05-06 NOTE — Assessment & Plan Note (Signed)
Influenza vaccination provided today. She will discuss Shingrix and Pneumovax with neurology. Pap smear due, I offered today but she declined and would like to see GYN. She will call for an appointment.  Mammogram due in December 2021, orders placed. Colonoscopy due per patient, referral placed to GI.  Encouraged a healthy diet, regular activity. Exam today stable.

## 2020-05-06 NOTE — Assessment & Plan Note (Signed)
Slightly improved on recent labs. ASCVD risk score of 2.7%. Continue to monitor.

## 2020-05-06 NOTE — Assessment & Plan Note (Signed)
Normal on recent labs. Continue to monitor.  

## 2020-05-06 NOTE — Patient Instructions (Addendum)
Call GYN to set up an appointment for your Pap smear.  [facility]   Discuss Shingrix and Pneumovax with your neurologist.  Continue to work on a healthy diet. Ensure you are consuming 64 ounces of water daily.  You will be contacted regarding your referral to GI for the colonoscopy.  Please let us know if you have not been contacted within two weeks.   It was a pleasure to see you today!   Preventive Care 33-62 Years Old, Female Preventive care refers to visits with your health care provider and lifestyle choices that can promote health and wellness. This includes:  A yearly physical exam. This may also be called an annual well check.  Regular dental visits and eye exams.  Immunizations.  Screening for certain conditions.  Healthy lifestyle choices, such as eating a healthy diet, getting regular exercise, not using drugs or products that contain nicotine and tobacco, and limiting alcohol use. What can I expect for my preventive care visit? Physical exam Your health care provider will check your:  Height and weight. This may be used to calculate body mass index (BMI), which tells if you are at a healthy weight.  Heart rate and blood pressure.  Skin for abnormal spots. Counseling Your health care provider may ask you questions about your:  Alcohol, tobacco, and drug use.  Emotional well-being.  Home and relationship well-being.  Sexual activity.  Eating habits.  Work and work Statistician.  Method of birth control.  Menstrual cycle.  Pregnancy history. What immunizations do I need?  Influenza (flu) vaccine  This is recommended every year. Tetanus, diphtheria, and pertussis (Tdap) vaccine  You may need a Td booster every 10 years. Varicella (chickenpox) vaccine  You may need this if you have not been vaccinated. Zoster (shingles) vaccine  You may need this after age 72. Measles, mumps, and rubella (MMR) vaccine  You may need at least one dose of MMR  if you were born in 1957 or later. You may also need a second dose. Pneumococcal conjugate (PCV13) vaccine  You may need this if you have certain conditions and were not previously vaccinated. Pneumococcal polysaccharide (PPSV23) vaccine  You may need one or two doses if you smoke cigarettes or if you have certain conditions. Meningococcal conjugate (MenACWY) vaccine  You may need this if you have certain conditions. Hepatitis A vaccine  You may need this if you have certain conditions or if you travel or work in places where you may be exposed to hepatitis A. Hepatitis B vaccine  You may need this if you have certain conditions or if you travel or work in places where you may be exposed to hepatitis B. Haemophilus influenzae type b (Hib) vaccine  You may need this if you have certain conditions. Human papillomavirus (HPV) vaccine  If recommended by your health care provider, you may need three doses over 6 months. You may receive vaccines as individual doses or as more than one vaccine together in one shot (combination vaccines). Talk with your health care provider about the risks and benefits of combination vaccines. What tests do I need? Blood tests  Lipid and cholesterol levels. These may be checked every 5 years, or more frequently if you are over 67 years old.  Hepatitis C test.  Hepatitis B test. Screening  Lung cancer screening. You may have this screening every year starting at age 64 if you have a 30-pack-year history of smoking and currently smoke or have quit within the past  15 years.  Colorectal cancer screening. All adults should have this screening starting at age 22 and continuing until age 9. Your health care provider may recommend screening at age 80 if you are at increased risk. You will have tests every 1-10 years, depending on your results and the type of screening test.  Diabetes screening. This is done by checking your blood sugar (glucose) after you have  not eaten for a while (fasting). You may have this done every 1-3 years.  Mammogram. This may be done every 1-2 years. Talk with your health care provider about when you should start having regular mammograms. This may depend on whether you have a family history of breast cancer.  BRCA-related cancer screening. This may be done if you have a family history of breast, ovarian, tubal, or peritoneal cancers.  Pelvic exam and Pap test. This may be done every 3 years starting at age 74. Starting at age 74, this may be done every 5 years if you have a Pap test in combination with an HPV test. Other tests  Sexually transmitted disease (STD) testing.  Bone density scan. This is done to screen for osteoporosis. You may have this scan if you are at high risk for osteoporosis. Follow these instructions at home: Eating and drinking  Eat a diet that includes fresh fruits and vegetables, whole grains, lean protein, and low-fat dairy.  Take vitamin and mineral supplements as recommended by your health care provider.  Do not drink alcohol if: ? Your health care provider tells you not to drink. ? You are pregnant, may be pregnant, or are planning to become pregnant.  If you drink alcohol: ? Limit how much you have to 0-1 drink a day. ? Be aware of how much alcohol is in your drink. In the [place].S., one drink equals one 12 oz bottle of beer (355 mL), one 5 oz glass of wine (148 mL), or one 1 oz glass of hard liquor (44 mL). Lifestyle  Take daily care of your teeth and gums.  Stay active. Exercise for at least 30 minutes on 5 or more days each week.  Do not use any products that contain nicotine or tobacco, such as cigarettes, e-cigarettes, and chewing tobacco. If you need help quitting, ask your health care provider.  If you are sexually active, practice safe sex. Use a condom or other form of birth control (contraception) in order to prevent pregnancy and STIs (sexually transmitted infections).  If  told by your health care provider, take low-dose aspirin daily starting at age 30. What's next?  Visit your health care provider once a year for a well check visit.  Ask your health care provider how often you should have your eyes and teeth checked.  Stay up to date on all vaccines. This information is not intended to replace advice given to you by your health care provider. Make sure you discuss any questions you have with your health care provider. Document Revised: 03/14/2018 Document Reviewed: 03/14/2018 Elsevier Patient Education  [place].       Influenza (Flu) Vaccine (Inactivated or Recombinant): What You Need to Know 1. Why get vaccinated? Influenza vaccine can prevent influenza (flu). Flu is a contagious disease that spreads around the [place] every year, usually between October and May. Anyone can get the flu, but it is more dangerous for some people. Infants and young children, people 81 years of age and older, pregnant women, and people with certain health conditions or a  weakened immune system are at greatest risk of flu complications. Pneumonia, bronchitis, sinus infections and ear infections are examples of flu-related complications. If you have a medical condition, such as heart disease, cancer or diabetes, flu can make it worse. Flu can cause fever and chills, sore throat, muscle aches, fatigue, cough, headache, and runny or stuffy nose. Some people may have vomiting and diarrhea, though this is more common in children than adults. Each year thousands of people in the [place] die from flu, and many more are hospitalized. Flu vaccine prevents millions of illnesses and flu-related visits to the doctor each year. 2. Influenza vaccine CDC recommends everyone 52 months of age and older get vaccinated every flu season. Children 6 months through 73 years of age may need 2 doses during a single flu season. Everyone else needs only 1 dose each flu season. It  takes about 2 weeks for protection to develop after vaccination. There are many flu viruses, and they are always changing. Each year a new flu vaccine is made to protect against three or four viruses that are likely to cause disease in the upcoming flu season. Even when the vaccine doesn't exactly match these viruses, it may still provide some protection. Influenza vaccine does not cause flu. Influenza vaccine may be given at the same time as other vaccines. 3. Talk with your health care provider Tell your vaccine provider if the person getting the vaccine:  Has had an allergic reaction after a previous dose of influenza vaccine, or has any severe, life-threatening allergies.  Has ever had Guillain-Barr Syndrome (also called GBS). In some cases, your health care provider may decide to postpone influenza vaccination to a future visit. People with minor illnesses, such as a cold, may be vaccinated. People who are moderately or severely ill should usually wait until they recover before getting influenza vaccine. Your health care provider can give you more information. 4. Risks of a vaccine reaction  Soreness, redness, and swelling where shot is given, fever, muscle aches, and headache can happen after influenza vaccine.  There may be a very small increased risk of Guillain-Barr Syndrome (GBS) after inactivated influenza vaccine (the flu shot). Young children who get the flu shot along with pneumococcal vaccine (PCV13), and/or DTaP vaccine at the same time might be slightly more likely to have a seizure caused by fever. Tell your health care provider if a child who is getting flu vaccine has ever had a seizure. People sometimes faint after medical procedures, including vaccination. Tell your provider if you feel dizzy or have vision changes or ringing in the ears. As with any medicine, there is a very remote chance of a vaccine causing a severe allergic reaction, other serious injury, or death. 5.  What if there is a serious problem? An allergic reaction could occur after the vaccinated person leaves the clinic. If you see signs of a severe allergic reaction (hives, swelling of the face and throat, difficulty breathing, a fast heartbeat, dizziness, or weakness), call 9-1-1 and get the person to the nearest hospital. For other signs that concern you, call your health care provider. Adverse reactions should be reported to the Vaccine Adverse Event Reporting System (VAERS). Your health care provider will usually file this report, or you can do it yourself. Visit the VAERS website at www.vaers.SamedayNews.es or call 915-307-9739.VAERS is only for reporting reactions, and VAERS staff do not give medical advice. 6. The National Vaccine Injury Compensation Program The [employer] Vaccine Injury Compensation Program ([place]) is  a federal program that was created to compensate people who may have been injured by certain vaccines. Visit the VICP website at GoldCloset.com.ee or call (401)245-0256 to learn about the program and about filing a claim. There is a time limit to file a claim for compensation. 7. How can I learn more?  Ask your healthcare provider.  Call your local or state health department.  Contact the Centers for Disease Control and Prevention (CDC): ? Call 867-394-2741 (1-800-CDC-INFO) or ? Visit CDC's https://gibson.com/ Vaccine Information Statement (Interim) Inactivated Influenza Vaccine (02/28/2018) This information is not intended to replace advice given to you by your health care provider. Make sure you discuss any questions you have with your health care provider. Document Revised: 10/22/2018 Document Reviewed: 03/04/2018 Elsevier Patient Education  2020 [place] have an appointment scheduled for: '[]'   2D Mammogram  '[]'   3D Mammogram  '[]'   Bone Density   Call for appointment    Your appointment will at the following location  '[x]'   [facility]  [place] [address]  [phone]  '[]'   [place] at [facility])   [address] [place], [place] [address]  [phone]  '[]'   [facility]      [place], [place]         '[]'   [facility]  [place], [place]   Make sure to wear two peace clothing  No lotions powders or deodorants the day of the appointment Make sure to bring picture ID and insurance card.  Bring list of medications you are currently taking including any supplements.

## 2020-05-06 NOTE — Progress Notes (Signed)
Subjective:    Patient ID: Natasha George, female    DOB: 08-Mar-1956, 64 y.o.   MRN: [id number]  HPI  This visit occurred during the SARS-CoV-2 public health emergency.  Safety protocols were in place, including screening questions prior to the visit, additional usage of staff PPE, and extensive cleaning of exam room while observing appropriate contact time as indicated for disinfecting solutions.   Natasha George is a 64 year old female who presents today for complete physical.  Immunizations: -Tetanus: Completed in 2012 -Influenza: Due -Shingles: Never completed, declines  -Pneumonia: Never completed  -Covid-19: Completed series  Diet: She endorses a healthy diet for the most part.  Exercise: She is active regularly.  Eye exam: Completes annually Dental exam: No recent exam, she will schedule  Pap Smear: Follows with GYN Mammogram: Due in December 2021 Colonoscopy: Completed in 2018 Hep C Screen: Negative  BP Readings from Last 3 Encounters:  05/06/20 118/64  05/06/19 124/80  08/13/18 122/78   The 10-year ASCVD risk score Mikey Bussing DC Jr., et al., 2013) is: 2.7%   Values used to calculate the score:     Age: 37 years     Sex: Female     Is Non-Hispanic African American: No     Diabetic: No     Tobacco smoker: No     Systolic Blood Pressure: 585 mmHg     Is BP treated: No     HDL Cholesterol: 97.7 mg/dL     Total Cholesterol: 222 mg/dL    Review of Systems  Constitutional: Negative for unexpected weight change.  HENT: Negative for rhinorrhea.   Respiratory: Negative for cough and shortness of breath.   Cardiovascular: Negative for chest pain.  Gastrointestinal: Negative for constipation and diarrhea.  Genitourinary: Negative for difficulty urinating and menstrual problem.  Musculoskeletal: Positive for arthralgias and myalgias.  Skin: Negative for rash.  Allergic/Immunologic: Negative for environmental allergies.  Neurological: Negative for dizziness,  numbness and headaches.  Hematological: Negative for adenopathy.  Psychiatric/Behavioral: The patient is not nervous/anxious.        Past Medical History:  Diagnosis Date  . Chickenpox   . Multiple sclerosis ([place])   . Rosacea   . Seizure [facility])      Social History   Socioeconomic History  . Marital status: Married    Spouse name: Not on file  . Number of children: Not on file  . Years of education: Not on file  . Highest education level: Not on file  Occupational History  . Not on file  Tobacco Use  . Smoking status: Never Smoker  . Smokeless tobacco: Never Used  Substance and Sexual Activity  . Alcohol use: Yes  . Drug use: Not on file  . Sexual activity: Not on file  Other Topics Concern  . Not on file  Social History Narrative  . Not on file   Social Determinants of Health   Financial Resource Strain:   . Difficulty of Paying Living Expenses: Not on file  Food Insecurity:   . Worried About Charity fundraiser in the Last Year: Not on file  . Ran Out of Food in the Last Year: Not on file  Transportation Needs:   . Lack of Transportation (Medical): Not on file  . Lack of Transportation (Non-Medical): Not on file  Physical Activity:   . Days of Exercise per Week: Not on file  . Minutes of Exercise per Session: Not on file  Stress:   . Feeling  of Stress : Not on file  Social Connections:   . Frequency of Communication with Friends and Family: Not on file  . Frequency of Social Gatherings with Friends and Family: Not on file  . Attends Religious Services: Not on file  . Active Member of Clubs or Organizations: Not on file  . Attends Archivist Meetings: Not on file  . Marital Status: Not on file  Intimate Partner Violence:   . Fear of Current or Ex-Partner: Not on file  . Emotionally Abused: Not on file  . Physically Abused: Not on file  . Sexually Abused: Not on file    Past Surgical History:  Procedure Laterality Date  . GASTROCNEMIUS  RECESSION Left 12/24/2019    Family History  Adopted: Yes  Problem Relation Age of Onset  . Breast cancer Neg Hx     No Known Allergies  Current Outpatient Medications on File Prior to Visit  Medication Sig Dispense Refill  . Cholecalciferol 125 MCG (5000 UT) TABS cholecalciferol (vitamin D3) 125 mcg (5,000 unit) tablet    . Multiple Vitamins-Minerals (MULTIVITAMIN WOMEN PO) Take by mouth.    . VUMERITY 231 MG CPDR Take by mouth. Taking 2 tablets by mouth in the am and pm    . levETIRAcetam (KEPPRA) 500 MG tablet Taking 500 mg by mouth in the morning and 1000 mg in the evening     No current facility-administered medications on file prior to visit.    BP 118/64   Pulse 79   Temp 98.2 F (36.8 C) (Temporal)   Ht 5\' 3"  (1.6 m)   Wt 124 lb (56.2 kg)   SpO2 99%   BMI 21.97 kg/m    Objective:   Physical Exam HENT:     Right Ear: Tympanic membrane and ear canal normal.     Left Ear: Tympanic membrane and ear canal normal.  Eyes:     Pupils: Pupils are equal, round, and reactive to light.  Cardiovascular:     Rate and Rhythm: Normal rate and regular rhythm.  Pulmonary:     Effort: Pulmonary effort is normal.     Breath sounds: Normal breath sounds.  Abdominal:     General: Bowel sounds are normal.     Palpations: Abdomen is soft.     Tenderness: There is no abdominal tenderness.  Musculoskeletal:        General: Normal range of motion.     Cervical back: Neck supple.  Skin:    General: Skin is warm and dry.  Neurological:     Mental Status: She is alert and oriented to person, place, and time.     Cranial Nerves: No cranial nerve deficit.     Deep Tendon Reflexes:     Reflex Scores:      Patellar reflexes are 2+ on the right side and 2+ on the left side. Psychiatric:        Mood and Affect: Mood normal.            Assessment & Plan:

## 2020-05-06 NOTE — Assessment & Plan Note (Signed)
No recent seizure activity, continue Keppra.  Follows with neurology.

## 2020-05-06 NOTE — Assessment & Plan Note (Signed)
Following with neurology through [facility], now on Vumerity. Continue current regimen.

## 2020-05-06 NOTE — Assessment & Plan Note (Signed)
Followed with orthopedics and underwent PT and spinal injection and MRI, no improvement.  Neurology repeated MRI and sent her to PT again without improvement.  Saw another orthopedist in [place] in Summer 2021 who diagnosed a tendon issue. Underwent lengthening of her tendon procedure in Summer 2021. Is doing better, now in PT.   Continue PT. Recovery could take a year.

## 2020-05-13 ENCOUNTER — Other Ambulatory Visit: Payer: Self-pay | Admitting: Primary Care

## 2020-05-13 DIAGNOSIS — Z1231 Encounter for screening mammogram for malignant neoplasm of breast: Secondary | ICD-10-CM

## 2020-05-14 ENCOUNTER — Other Ambulatory Visit: Payer: Self-pay

## 2020-05-14 ENCOUNTER — Telehealth (INDEPENDENT_AMBULATORY_CARE_PROVIDER_SITE_OTHER): Payer: Self-pay | Admitting: Gastroenterology

## 2020-05-14 DIAGNOSIS — Z86010 Personal history of colonic polyps: Secondary | ICD-10-CM

## 2020-05-14 MED ORDER — NA SULFATE-K SULFATE-MG SULF 17.5-3.13-1.6 GM/177ML PO SOLN: 1.0000 | Freq: Once | ORAL | 0 refills | Status: AC

## 2020-05-14 NOTE — Progress Notes (Signed)
Gastroenterology Pre-Procedure Review  Request Date: 05/27/20 Requesting Physician: Dr. Vicente Males  PATIENT REVIEW QUESTIONS: The patient responded to the following health history questions as indicated:    1. Are you having any GI issues? no 2. Do you have a personal history of Polyps? yes (01/24/17 colon polyps colonoscopy not performed locally) 3. Do you have a family history of Colon Cancer or Polyps? no 4. Diabetes Mellitus? no 5. Joint replacements in the past 12 months?no strayer procedure to stretch calf muscle June 9th 6. Major health problems in the past 3 months?no 7. Any artificial heart valves, MVP, or defibrillator?no    MEDICATIONS & ALLERGIES:    Patient reports the following regarding taking any anticoagulation/antiplatelet therapy:   Plavix, Coumadin, Eliquis, Xarelto, Lovenox, Pradaxa, Brilinta, or Effient? no Aspirin? no  Patient confirms/reports the following medications:  Current Outpatient Medications  Medication Sig Dispense Refill   Cholecalciferol 125 MCG (5000 UT) TABS cholecalciferol (vitamin D3) 125 mcg (5,000 unit) tablet     levETIRAcetam (KEPPRA) 500 MG tablet Taking 500 mg by mouth in the morning and 1000 mg in the evening     Multiple Vitamins-Minerals (MULTIVITAMIN WOMEN PO) Take by mouth.     VUMERITY 231 MG CPDR Take by mouth. Taking 2 tablets by mouth in the am and pm     Na Sulfate-K Sulfate-Mg Sulf 17.5-3.13-1.6 GM/177ML SOLN Take 1 kit by mouth once for 1 dose. 354 mL 0   No current facility-administered medications for this visit.    Patient confirms/reports the following allergies:  No Known Allergies  Orders Placed This Encounter  Procedures   Procedural/ Surgical Case Request: COLONOSCOPY WITH PROPOFOL    Standing Status:   Standing    Number of Occurrences:   1    Order Specific Question:   Pre-op diagnosis    Answer:   personal history of colon polyps    Order Specific Question:   CPT Code    Answer:   70761    AUTHORIZATION  INFORMATION Primary Insurance: 1D#: Group #:  Secondary Insurance: 1D#: Group #:  SCHEDULE INFORMATION: Date: 05/27/20 Time: Location:[facility]

## 2020-05-17 ENCOUNTER — Other Ambulatory Visit: Payer: Self-pay

## 2020-05-17 MED ORDER — NA SULFATE-K SULFATE-MG SULF 17.5-3.13-1.6 GM/177ML PO SOLN: 1.0000 | Freq: Once | ORAL | 0 refills | Status: AC

## 2020-05-21 ENCOUNTER — Other Ambulatory Visit: Payer: Self-pay

## 2020-05-21 DIAGNOSIS — Z1211 Encounter for screening for malignant neoplasm of colon: Secondary | ICD-10-CM

## 2020-05-21 NOTE — Progress Notes (Signed)
Patients bowel prep has been changed to [employer] (generic).  Pt has been advised of directions via mychart and phone call.  Thanks,  [place], [place]

## 2020-05-25 ENCOUNTER — Telehealth: Payer: Self-pay

## 2020-05-25 ENCOUNTER — Other Ambulatory Visit
Admission: RE | Admit: 2020-05-25 | Discharge: 2020-05-25 | Disposition: A | Discharge status: Home / Self Care | Payer: BC Managed Care – PPO | Source: Ambulatory Visit | Attending: Gastroenterology | Admitting: Gastroenterology

## 2020-05-25 ENCOUNTER — Other Ambulatory Visit: Payer: Self-pay

## 2020-05-25 DIAGNOSIS — G35 Multiple sclerosis: Secondary | ICD-10-CM | POA: Diagnosis not present

## 2020-05-25 DIAGNOSIS — K5730 Diverticulosis of large intestine without perforation or abscess without bleeding: Secondary | ICD-10-CM | POA: Diagnosis not present

## 2020-05-25 DIAGNOSIS — Z01818 Encounter for other preprocedural examination: Secondary | ICD-10-CM | POA: Insufficient documentation

## 2020-05-25 DIAGNOSIS — Z86010 Personal history of colonic polyps: Secondary | ICD-10-CM | POA: Diagnosis not present

## 2020-05-25 DIAGNOSIS — K635 Polyp of colon: Secondary | ICD-10-CM | POA: Diagnosis not present

## 2020-05-25 DIAGNOSIS — Z20822 Contact with and (suspected) exposure to covid-19: Secondary | ICD-10-CM | POA: Insufficient documentation

## 2020-05-25 DIAGNOSIS — R569 Unspecified convulsions: Secondary | ICD-10-CM | POA: Diagnosis not present

## 2020-05-25 DIAGNOSIS — Z79899 Other long term (current) drug therapy: Secondary | ICD-10-CM | POA: Diagnosis not present

## 2020-05-25 DIAGNOSIS — Z1211 Encounter for screening for malignant neoplasm of colon: Secondary | ICD-10-CM | POA: Diagnosis not present

## 2020-05-25 DIAGNOSIS — D124 Benign neoplasm of descending colon: Secondary | ICD-10-CM | POA: Diagnosis not present

## 2020-05-25 LAB — SARS CORONAVIRUS 2 (TAT 6-24 HRS): SARS Coronavirus 2: NEGATIVE

## 2020-05-25 NOTE — Telephone Encounter (Signed)
Returned patient call. Patient had questions regarding prep. Answered questions and pt verbalized understanding.

## 2020-05-27 ENCOUNTER — Ambulatory Visit
Admission: RE | Admit: 2020-05-27 | Discharge: 2020-05-27 | Disposition: A | Discharge status: Home / Self Care | Payer: BC Managed Care – PPO | Attending: Gastroenterology | Admitting: Gastroenterology

## 2020-05-27 ENCOUNTER — Ambulatory Visit: Payer: BC Managed Care – PPO | Admitting: Anesthesiology

## 2020-05-27 ENCOUNTER — Encounter
Admission: RE | Disposition: A | Discharge status: Home / Self Care | Payer: Self-pay | Source: Home / Self Care | Attending: Gastroenterology

## 2020-05-27 ENCOUNTER — Encounter: Payer: Self-pay | Admitting: Gastroenterology

## 2020-05-27 ENCOUNTER — Other Ambulatory Visit: Payer: Self-pay

## 2020-05-27 DIAGNOSIS — Z86010 Personal history of colonic polyps: Secondary | ICD-10-CM | POA: Insufficient documentation

## 2020-05-27 DIAGNOSIS — Z20822 Contact with and (suspected) exposure to covid-19: Secondary | ICD-10-CM | POA: Insufficient documentation

## 2020-05-27 DIAGNOSIS — K635 Polyp of colon: Secondary | ICD-10-CM | POA: Insufficient documentation

## 2020-05-27 DIAGNOSIS — G35 Multiple sclerosis: Secondary | ICD-10-CM | POA: Insufficient documentation

## 2020-05-27 DIAGNOSIS — Z1211 Encounter for screening for malignant neoplasm of colon: Secondary | ICD-10-CM | POA: Insufficient documentation

## 2020-05-27 DIAGNOSIS — Z79899 Other long term (current) drug therapy: Secondary | ICD-10-CM | POA: Insufficient documentation

## 2020-05-27 DIAGNOSIS — D124 Benign neoplasm of descending colon: Secondary | ICD-10-CM | POA: Insufficient documentation

## 2020-05-27 DIAGNOSIS — R569 Unspecified convulsions: Secondary | ICD-10-CM | POA: Insufficient documentation

## 2020-05-27 DIAGNOSIS — K5730 Diverticulosis of large intestine without perforation or abscess without bleeding: Secondary | ICD-10-CM | POA: Insufficient documentation

## 2020-05-27 HISTORY — PX: COLONOSCOPY WITH PROPOFOL: SHX5780

## 2020-05-27 SURGERY — COLONOSCOPY WITH PROPOFOL
Anesthesia: General

## 2020-05-27 MED ORDER — PROPOFOL 10 MG/ML IV BOLUS
INTRAVENOUS | Status: DC | PRN
  Administered 2020-05-27 (×2): 20 mg via INTRAVENOUS
  Administered 2020-05-27: 10 mg via INTRAVENOUS
  Administered 2020-05-27: 50 mg via INTRAVENOUS

## 2020-05-27 MED ORDER — PROPOFOL 500 MG/50ML IV EMUL
INTRAVENOUS | Status: AC
  Filled 2020-05-27: qty 350

## 2020-05-27 MED ORDER — PROPOFOL 500 MG/50ML IV EMUL
INTRAVENOUS | Status: AC
  Filled 2020-05-27: qty 100

## 2020-05-27 MED ORDER — PROPOFOL 500 MG/50ML IV EMUL
INTRAVENOUS | Status: AC
  Filled 2020-05-27: qty 50

## 2020-05-27 MED ORDER — PROPOFOL 500 MG/50ML IV EMUL
INTRAVENOUS | Status: DC | PRN
  Administered 2020-05-27: 165 ug/kg/min via INTRAVENOUS

## 2020-05-27 MED ORDER — LIDOCAINE HCL (CARDIAC) PF 100 MG/5ML IV SOSY
PREFILLED_SYRINGE | INTRAVENOUS | Status: DC | PRN
  Administered 2020-05-27: 100 mg via INTRAVENOUS

## 2020-05-27 MED ORDER — SODIUM CHLORIDE 0.9 % IV SOLN: INTRAVENOUS | Status: DC

## 2020-05-27 NOTE — Anesthesia Preprocedure Evaluation (Signed)
Anesthesia Evaluation  Patient identified by MRN, date of birth, ID band Patient awake    Reviewed: Allergy & Precautions, NPO status , Patient's Chart, lab work & pertinent test results  Airway Mallampati: II       Dental no notable dental hx.    Pulmonary neg pulmonary ROS,    Pulmonary exam normal        Cardiovascular negative cardio ROS Normal cardiovascular exam Rhythm:Regular Rate:Normal     Neuro/Psych Seizures -,  MS negative psych ROS   GI/Hepatic negative GI ROS, Neg liver ROS,   Endo/Other  negative endocrine ROS  Renal/GU negative Renal ROS  negative genitourinary   Musculoskeletal negative musculoskeletal ROS (+)   Abdominal   Peds negative pediatric ROS (+)  Hematology negative hematology ROS (+)   Anesthesia Other Findings .Marland KitchenPast Medical History: No date: Chickenpox No date: Multiple sclerosis ([facility]) No date: Rosacea No date: Seizure ([place])   Reproductive/Obstetrics negative OB ROS                             Anesthesia Physical Anesthesia Plan  ASA: II  Anesthesia Plan: General   Post-op Pain Management:    Induction: Intravenous  PONV Risk Score and Plan: 3 and Propofol infusion  Airway Management Planned: Nasal Cannula  Additional Equipment: None  Intra-op Plan:   Post-operative Plan:   Informed Consent: I have reviewed the patients History and Physical, chart, labs and discussed the procedure including the risks, benefits and alternatives for the proposed anesthesia with the patient or authorized representative who has indicated his/her understanding and acceptance.       Plan Discussed with: CRNA, Anesthesiologist and Surgeon  Anesthesia Plan Comments:         Anesthesia Quick Evaluation

## 2020-05-27 NOTE — H&P (Signed)
Jonathon Bellows, MD [address], [place], [place], [place], [id number] [id number], [place], [place], [place], [address] Phone: [phone]  Fax: [phone]  Primary Care Physician:  Pleas Koch, NP   Pre-Procedure History & Physical: HPI:  Natasha George is a 64 y.o. female is here for an colonoscopy.   Past Medical History:  Diagnosis Date  . Chickenpox   . Multiple sclerosis ([place])   . Rosacea   . Seizure [facility])     Past Surgical History:  Procedure Laterality Date  . COLONOSCOPY    . GASTROCNEMIUS RECESSION Left 12/24/2019    Prior to Admission medications   Medication Sig Start Date End Date Taking? Authorizing Provider  Cholecalciferol 125 MCG (5000 UT) TABS cholecalciferol (vitamin D3) 125 mcg (5,000 unit) tablet 07/17/1968  Yes [provider]  levETIRAcetam (KEPPRA) 500 MG tablet Taking 500 mg by mouth in the morning and 1000 mg in the evening 01/17/18  Yes [provider]  Multiple Vitamins-Minerals (MULTIVITAMIN WOMEN PO) Take by mouth.   Yes [provider]  VUMERITY 231 MG CPDR Take by mouth. Taking 2 tablets by mouth in the am and pm   Yes [provider]    Allergies as of 05/14/2020  . (No Known Allergies)    Family History  Adopted: Yes  Problem Relation Age of Onset  . Breast cancer Neg Hx     Social History   Socioeconomic History  . Marital status: Married    Spouse name: Not on file  . Number of children: Not on file  . Years of education: Not on file  . Highest education level: Not on file  Occupational History  . Not on file  Tobacco Use  . Smoking status: Never Smoker  . Smokeless tobacco: Never Used  Vaping Use  . Vaping Use: Never used  Substance and Sexual Activity  . Alcohol use: Yes    Alcohol/week: 4.0 standard drinks    Types: 4 Glasses of wine per week  . Drug use: Never  . Sexual activity: Not on file  Other Topics Concern  . Not on file  Social History Narrative  . Not  on file   Social Determinants of Health   Financial Resource Strain:   . Difficulty of Paying Living Expenses: Not on file  Food Insecurity:   . Worried About Charity fundraiser in the Last Year: Not on file  . Ran Out of Food in the Last Year: Not on file  Transportation Needs:   . Lack of Transportation (Medical): Not on file  . Lack of Transportation (Non-Medical): Not on file  Physical Activity:   . Days of Exercise per Week: Not on file  . Minutes of Exercise per Session: Not on file  Stress:   . Feeling of Stress : Not on file  Social Connections:   . Frequency of Communication with Friends and Family: Not on file  . Frequency of Social Gatherings with Friends and Family: Not on file  . Attends Religious Services: Not on file  . Active Member of Clubs or Organizations: Not on file  . Attends Archivist Meetings: Not on file  . Marital Status: Not on file  Intimate Partner Violence:   . Fear of Current or Ex-Partner: Not on file  . Emotionally Abused: Not on file  . Physically Abused: Not on file  . Sexually Abused: Not on file    Review of Systems: See HPI, otherwise negative ROS  Physical Exam: BP 125/76   Pulse 76   Temp (!) 97.4 F (36.3 C)   Resp 16   Ht 5\' 3"  (1.6 m)   Wt 55.3 kg   SpO2 100%   BMI 21.61 kg/m  General:   Alert,  pleasant and cooperative in NAD Head:  Normocephalic and atraumatic. Neck:  Supple; no masses or thyromegaly. Lungs:  Clear throughout to auscultation, normal respiratory effort.    Heart:  +S1, +S2, Regular rate and rhythm, No edema. Abdomen:  Soft, nontender and nondistended. Normal bowel sounds, without guarding, and without rebound.   Neurologic:  Alert and  oriented x4;  grossly normal neurologically.  Impression/Plan: Natasha George is here for an colonoscopy to be performed for surveillance due to prior history of colon polyps   Risks, benefits, limitations, and alternatives regarding  colonoscopy have  been reviewed with the patient.  Questions have been answered.  All parties agreeable.   Jonathon Bellows, MD  05/27/2020, 8:04 AM

## 2020-05-27 NOTE — Brief Op Note (Signed)
Descending polyps x3 taken, only 1 retrieved during colonoscopy

## 2020-05-27 NOTE — Op Note (Signed)
[facility]astroenterology Patient Name: Natasha George Procedure Date: 05/27/2020 7:46 AM MRN: [id number] Account #: [id number] Date of Birth: 06/10/1956 Admit Type: Outpatient Age: 64 Room: [facility] ENDO ROOM 3 Gender: Female Note Status: Finalized Procedure:             Colonoscopy Indications:           Surveillance: Personal history of adenomatous polyps                         on last colonoscopy 3 years ago, High risk colon                         cancer surveillance: Personal history of adenoma (10                         mm or greater in size) Providers:             Jonathon Bellows MD, MD Referring MD:          Pleas Koch (Referring MD) Medicines:             Monitored Anesthesia Care Complications:         No immediate complications. Procedure:             Pre-Anesthesia Assessment:                        - Prior to the procedure, a History and Physical was                         performed, and patient medications, allergies and                         sensitivities were reviewed. The patient's tolerance                         of previous anesthesia was reviewed.                        - The risks and benefits of the procedure and the                         sedation options and risks were discussed with the                         patient. All questions were answered and informed                         consent was obtained.                        - ASA Grade Assessment: II - A patient with mild                         systemic disease.                        After obtaining informed consent, the colonoscope was                         passed under direct vision.  Throughout the procedure,                         the patient's blood pressure, pulse, and oxygen                         saturations were monitored continuously. The                         Colonoscope was introduced through the anus and                         advanced to the the  cecum, identified by the                         appendiceal orifice. The colonoscopy was performed                         with ease. The patient tolerated the procedure well.                         The quality of the bowel preparation was excellent. Findings:      The perianal and digital rectal examinations were normal.      Multiple small-mouthed diverticula were found in the sigmoid colon.      Two sessile polyps were found in the descending colon. The polyps were 4       to 5 mm in size. These polyps were removed with a cold snare. Resection       and retrieval were complete.      Three sessile polyps were found in the ascending colon. The polyps were       4 to 5 mm in size. These polyps were removed with a cold snare.       Resection was complete, but the polyp tissue was only partially       retrieved.      The exam was otherwise without abnormality on direct and retroflexion       views. Impression:            - Diverticulosis in the sigmoid colon.                        - Two 4 to 5 mm polyps in the descending colon,                         removed with a cold snare. Resected and retrieved.                        - Three 4 to 5 mm polyps in the ascending colon,                         removed with a cold snare. Complete resection. Partial                         retrieval.                        - The examination was otherwise normal on direct and  retroflexion views. Recommendation:        - Discharge patient to home (with escort).                        - Resume previous diet.                        - Continue present medications.                        - Await pathology results.                        - Repeat colonoscopy in 3 years for surveillance. Procedure Code(s):     --- Professional ---                        [phone], Colonoscopy, flexible; with removal of                         tumor(s), polyp(s), or other lesion(s) by snare                          technique Diagnosis Code(s):     --- Professional ---                        K63.5, Polyp of colon                        K57.30, Diverticulosis of large intestine without                         perforation or abscess without bleeding                        Z86.010, Personal history of colonic polyps CPT copyright 2019 American Medical Association. All rights reserved. The codes documented in this report are preliminary and upon coder review may  be revised to meet current compliance requirements. Jonathon Bellows, MD Jonathon Bellows MD, MD 05/27/2020 8:37:23 AM This report has been signed electronically. Number of Addenda: 0 Note Initiated On: 05/27/2020 7:46 AM Scope Withdrawal Time: 0 hours 15 minutes 18 seconds  Total Procedure Duration: 0 hours 22 minutes 46 seconds  Estimated Blood Loss:  Estimated blood loss: none.      [facility]

## 2020-05-27 NOTE — Anesthesia Postprocedure Evaluation (Signed)
Anesthesia Post Note  Patient: Natasha George  Procedure(s) Performed: COLONOSCOPY WITH PROPOFOL (N/A )  Patient location during evaluation: Endoscopy Anesthesia Type: General Level of consciousness: awake Pain management: pain level controlled Vital Signs Assessment: post-procedure vital signs reviewed and stable Respiratory status: spontaneous breathing and nonlabored ventilation Cardiovascular status: stable and blood pressure returned to baseline Postop Assessment: no apparent nausea or vomiting Anesthetic complications: no   No complications documented.   Last Vitals:  Vitals:   05/27/20 0840 05/27/20 0850  BP: 100/73 120/76  Pulse:    Resp:    Temp: (!) 36.3 C   SpO2:      Last Pain:  Vitals:   05/27/20 0900  TempSrc:   PainSc: 0-No pain                 Neva Seat

## 2020-05-27 NOTE — Anesthesia Procedure Notes (Signed)
Procedure Name: General with mask airway Performed by: Fletcher-Harrison, Severin Bou, CRNA Pre-anesthesia Checklist: Patient identified, Emergency Drugs available, Suction available and Patient being monitored Patient Re-evaluated:Patient Re-evaluated prior to induction Oxygen Delivery Method: Simple face mask Induction Type: IV induction Placement Confirmation: positive ETCO2 and CO2 detector Dental Injury: Teeth and Oropharynx as per pre-operative assessment        

## 2020-05-27 NOTE — Transfer of Care (Signed)
Immediate Anesthesia Transfer of Care Note  Patient: Natasha George  Procedure(s) Performed: COLONOSCOPY WITH PROPOFOL (N/A )  Patient Location: Endoscopy Unit  Anesthesia Type:General  Level of Consciousness: awake, drowsy and patient cooperative  Airway & Oxygen Therapy: Patient Spontanous Breathing  Post-op Assessment: Report given to RN and Post -op Vital signs reviewed and stable  Post vital signs: Reviewed and stable  Last Vitals:  Vitals Value Taken Time  BP 100/73 05/27/20 0840  Temp 36.3 C 05/27/20 0840  Pulse 72 05/27/20 0843  Resp 15 05/27/20 0843  SpO2 100 % 05/27/20 0843  Vitals shown include unvalidated device data.  Last Pain:  Vitals:   05/27/20 0840  TempSrc: Temporal  PainSc: 0-No pain         Complications: No complications documented.

## 2020-05-28 ENCOUNTER — Encounter: Payer: Self-pay | Admitting: Gastroenterology

## 2020-05-28 LAB — SURGICAL PATHOLOGY

## 2020-06-04 DIAGNOSIS — E875 Hyperkalemia: Secondary | ICD-10-CM | POA: Insufficient documentation

## 2020-06-21 ENCOUNTER — Telehealth: Payer: Self-pay | Admitting: *Deleted

## 2020-06-21 NOTE — Telephone Encounter (Signed)
Noted and appreciate the update to patient's medical record.

## 2020-06-21 NOTE — Telephone Encounter (Signed)
Patient called to let Anda Kraft know that she got her [place] covid booster at the [facility] on 06/18/20.  Documented in Epic.

## 2020-07-20 ENCOUNTER — Ambulatory Visit
Admission: RE | Admit: 2020-07-20 | Discharge: 2020-07-20 | Disposition: A | Discharge status: Home / Self Care | Payer: BC Managed Care – PPO | Source: Ambulatory Visit | Attending: Primary Care | Admitting: Primary Care

## 2020-07-20 ENCOUNTER — Other Ambulatory Visit: Payer: Self-pay

## 2020-07-20 DIAGNOSIS — Z1231 Encounter for screening mammogram for malignant neoplasm of breast: Secondary | ICD-10-CM | POA: Diagnosis present

## 2021-05-02 ENCOUNTER — Other Ambulatory Visit: Payer: BC Managed Care – PPO

## 2021-05-09 ENCOUNTER — Encounter: Payer: BC Managed Care – PPO | Admitting: Primary Care

## 2021-05-20 ENCOUNTER — Other Ambulatory Visit: Payer: Self-pay

## 2021-05-20 ENCOUNTER — Ambulatory Visit (INDEPENDENT_AMBULATORY_CARE_PROVIDER_SITE_OTHER): Payer: BC Managed Care – PPO

## 2021-05-20 DIAGNOSIS — Z23 Encounter for immunization: Secondary | ICD-10-CM | POA: Diagnosis not present

## 2021-05-24 ENCOUNTER — Encounter: Payer: Self-pay | Admitting: Primary Care

## 2021-05-24 ENCOUNTER — Other Ambulatory Visit: Payer: Self-pay

## 2021-05-24 ENCOUNTER — Telehealth (INDEPENDENT_AMBULATORY_CARE_PROVIDER_SITE_OTHER): Payer: BC Managed Care – PPO | Admitting: Primary Care

## 2021-05-24 VITALS — Ht 63.0 in | Wt 122.0 lb

## 2021-05-24 DIAGNOSIS — R21 Rash and other nonspecific skin eruption: Secondary | ICD-10-CM | POA: Diagnosis not present

## 2021-05-24 MED ORDER — MUPIROCIN CALCIUM 2 % EX CREA: 1.0000 "application " | TOPICAL_CREAM | Freq: Two times a day (BID) | CUTANEOUS | 0 refills | Status: DC

## 2021-05-24 NOTE — Patient Instructions (Signed)
Try the mupirocin cream.  Apply to the rash twice daily for about a week.  Please update me in a few days as discussed.  It was a pleasure to see you today!

## 2021-05-24 NOTE — Progress Notes (Signed)
Patient ID: Natasha George, female    DOB: 18-Jul-1955, 65 y.o.   MRN: [id number]  Virtual visit completed through [place], a video enabled telemedicine application. Due to national recommendations of social distancing due to COVID-19, a virtual visit is felt to be most appropriate for this patient at this time. Reviewed limitations, risks, security and privacy concerns of performing a virtual visit and the availability of in person appointments. I also reviewed that there may be a patient responsible charge related to this service. The patient agreed to proceed.   Patient location: home Provider location: [place] at [facility], office Persons participating in this virtual visit: patient, provider   If any vitals were documented, they were collected by patient at home unless specified below.    Ht 5\' 3"  (1.6 m)   Wt 122 lb (55.3 kg)   BMI 21.61 kg/m    CC: Rash Subjective:   HPI: Natasha George is a 65 y.o. female presenting on 05/24/2021 to discuss rash.  Symptoms began 2 weeks ago with dryness to the corners of her mouth.  She then developed a pimple-like rash from one corner of her mouth throughout her chin and to the other corner of her mouth.  She then began to notice crusting with oozing to the sites.  Also with development of the same type of rash to her right upper eyelid.  She denies pain, visual change.  Her rash is itchy.  She has not applied anything OTC for symptoms.  No new lotions, detergents, soaps or shampoos. No new medicines, vitamins, supplements. No new pets. No recent outdoor exposure or poison ivy exposure. No bonfire or smoke exposure.  No recent motel or hotel stay or new beds.   No fevers/chills, oral lesions, new joint pains, tick bites, abdominal pain, nausea.   No one else in her home has this rash.  She has not been around young children      Relevant past medical, surgical, family and social history reviewed and updated as indicated.  Interim medical history since our last visit reviewed. Allergies and medications reviewed and updated. Outpatient Medications Prior to Visit  Medication Sig Dispense Refill   Cholecalciferol 125 MCG (5000 UT) TABS cholecalciferol (vitamin D3) 125 mcg (5,000 unit) tablet     levETIRAcetam (KEPPRA) 500 MG tablet Taking 500 mg by mouth in the morning and 1000 mg in the evening     VUMERITY 231 MG CPDR Take by mouth. Taking 2 tablets by mouth in the am and pm     Multiple Vitamins-Minerals (MULTIVITAMIN WOMEN PO) Take by mouth.     No facility-administered medications prior to visit.     Per HPI unless specifically indicated in ROS section below Review of Systems  Eyes:  Negative for visual disturbance.  Musculoskeletal:  Negative for arthralgias.  Skin:  Positive for rash.  Objective:  Ht 5\' 3"  (1.6 m)   Wt 122 lb (55.3 kg)   BMI 21.61 kg/m   Wt Readings from Last 3 Encounters:  05/24/21 122 lb (55.3 kg)  05/27/20 122 lb (55.3 kg)  05/06/20 124 lb (56.2 kg)       Physical exam: General: Alert and oriented x 3, no distress, does not appear sickly  Pulmonary: Speaks in complete sentences without increased work of breathing, no cough during visit.  Psychiatric: Normal mood, thought content, and behavior.  Skin: Difficult to assess as her camera was slightly unfocused.  I did notice crusting to the left portion  of her chin with erythema to the bottom of her chin and right upper eyelid.     Results for orders placed or performed during the hospital encounter of 05/27/20  Surgical pathology  Result Value Ref Range   SURGICAL PATHOLOGY      SURGICAL PATHOLOGY CASE: [id number] PATIENT: Rashawn Mohl Surgical Pathology Report     Specimen Submitted: A. Colon polyp x2, descending; cold snare B. Colon polyp x1, ascending; cold snare  Clinical History: Personal history of colon polyps. Colon polyps, diverticulosis.    DIAGNOSIS: A. COLON POLYPS X2, DESCENDING;  COLD SNARE: - FRAGMENTS (X2) OF TUBULAR ADENOMAS. - NEGATIVE FOR HIGH-GRADE DYSPLASIA AND MALIGNANCY.  B. COLON POLYP, ASCENDING; COLD SNARE: - POLYPOID FRAGMENTS OF BENIGN COLONIC MUCOSA WITH PROMINENT SUBMUCOSAL LYMPHOID AGGREGATE. - NEGATIVE FOR DYSPLASIA AND MALIGNANCY.  GROSS DESCRIPTION: A. Labeled: Descending colon polyp cold snare x2 Received: In formalin Tissue fragment(s): Multiple Size: 1 x 1 x 0.2 cm Description: Aggregate of tan tissue fragments admixed with fecal matter Entirely submitted in 1 cassette.  B. Labeled: Ascending colon polyp cold snare (3 polyps snared, only one retrieved) Received: In formalin Tissu e fragment(s): 1 Size: 0.7 x 0.5 x 0.1 cm Description: Tan polypoid fragment Entirely submitted in 1 cassette.   Final Diagnosis performed by Allena Napoleon, MD.   Electronically signed 05/28/2020 9:53:19AM The electronic signature indicates that the named Attending Pathologist has evaluated the specimen Technical component performed at [facility], [address], [place], [place] [address] Lab: [phone] Dir: Rush Farmer, MD, MMM  Professional component performed at [facility], [facility], [place], [place], [place] [address] Lab: [phone] Dir: Dellia Nims. Reuel Derby, MD    Assessment & Plan:   Problem List Items Addressed This Visit       Musculoskeletal and Integument   Rash and nonspecific skin eruption - Primary    Unclear etiology, although mostly represents impetigo which would be more rare for an adult.  She does not appear to have herpes zoster, rosacea, or even contact dermatitis.  Trial of mupirocin cream sent to pharmacy for her to apply twice daily x1 week.  She will send an update in a few days.  If no improvement, consider topical versus oral steroids      Relevant Medications   mupirocin cream (BACTROBAN) 2 %     Meds ordered this encounter  Medications   mupirocin cream (BACTROBAN) 2 %    Sig: Apply  1 application topically 2 (two) times daily.    Dispense:  15 g    Refill:  0    Order Specific Question:   Supervising Provider    Answer:   BEDSOLE, AMY E [[id number]]   No orders of the defined types were placed in this encounter.   I discussed the assessment and treatment plan with the patient. The patient was provided an opportunity to ask questions and all were answered. The patient agreed with the plan and demonstrated an understanding of the instructions. The patient was advised to call back or seek an in-person evaluation if the symptoms worsen or if the condition fails to improve as anticipated.  Follow up plan:  Try the mupirocin cream.  Apply to the rash twice daily for about a week.  Please update me in a few days as discussed.  It was a pleasure to see you today!  Pleas Koch, NP

## 2021-05-24 NOTE — Assessment & Plan Note (Signed)
Unclear etiology, although mostly represents impetigo which would be more rare for an adult.  She does not appear to have herpes zoster, rosacea, or even contact dermatitis.  Trial of mupirocin cream sent to pharmacy for her to apply twice daily x1 week.  She will send an update in a few days.  If no improvement, consider topical versus oral steroids

## 2021-05-25 ENCOUNTER — Telehealth: Payer: Self-pay | Admitting: Primary Care

## 2021-05-25 DIAGNOSIS — R21 Rash and other nonspecific skin eruption: Secondary | ICD-10-CM

## 2021-05-25 MED ORDER — MUPIROCIN 2 % EX OINT: 1.0000 "application " | TOPICAL_OINTMENT | Freq: Two times a day (BID) | CUTANEOUS | 0 refills | Status: DC

## 2021-05-25 NOTE — Telephone Encounter (Signed)
Patient notified rx was changed

## 2021-05-25 NOTE — Telephone Encounter (Signed)
Noted, prescription changed and sent to [facility].

## 2021-05-25 NOTE — Telephone Encounter (Signed)
error 

## 2021-05-25 NOTE — Telephone Encounter (Signed)
Pt called in to know status of Rx  mupirocin cream  being change to ointment

## 2021-05-25 NOTE — Telephone Encounter (Signed)
Natasha George with [facility] called  stating that medication mupirocin cream (BACTROBAN) 2 % needs to be change to an ointment.

## 2021-05-25 NOTE — Addendum Note (Signed)
Addended by: Pleas Koch on: 05/25/2021 01:23 PM   Modules accepted: Orders

## 2021-05-27 ENCOUNTER — Telehealth: Payer: Self-pay

## 2021-05-27 DIAGNOSIS — R21 Rash and other nonspecific skin eruption: Secondary | ICD-10-CM

## 2021-05-27 NOTE — Telephone Encounter (Signed)
Request for prior auth was received on cover my meds. Once submitted response was that it has been covered by plan. I have sent my chart to patient to verify they have picked up with no issues.

## 2021-06-02 MED ORDER — PREDNISONE 20 MG PO TABS: ORAL_TABLET | ORAL | 0 refills | Status: DC

## 2021-06-02 NOTE — Telephone Encounter (Signed)
Pt called in wants to know if RX Prednison can be called to [facility] . Please advise [phone]

## 2021-06-06 DIAGNOSIS — R21 Rash and other nonspecific skin eruption: Secondary | ICD-10-CM

## 2021-06-14 MED ORDER — TRIAMCINOLONE ACETONIDE 0.1 % EX CREA: 1.0000 "application " | TOPICAL_CREAM | Freq: Two times a day (BID) | CUTANEOUS | 0 refills | Status: DC

## 2021-07-19 ENCOUNTER — Encounter: Payer: BC Managed Care – PPO | Admitting: Primary Care

## 2021-07-22 IMAGING — MG DIGITAL SCREENING BILAT W/ TOMO W/ CAD
8 series · 9 of 24 positions shown · non-contrast
Comparison: Previous exam(s).

CLINICAL DATA: Screening.

EXAM:
DIGITAL SCREENING BILATERAL MAMMOGRAM WITH TOMO AND CAD

[L CC synth-2D]
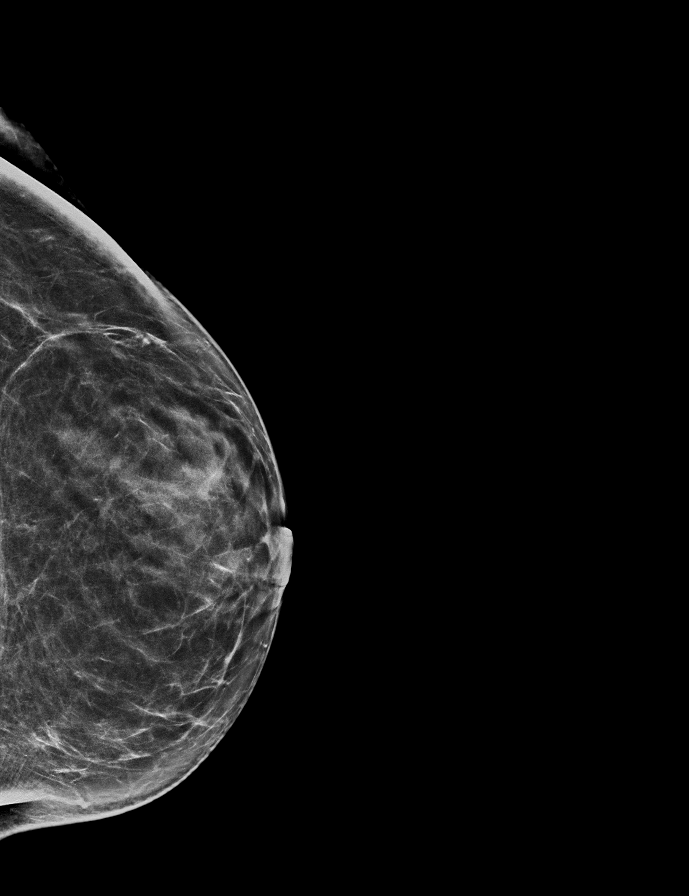

[R CC synth-2D]
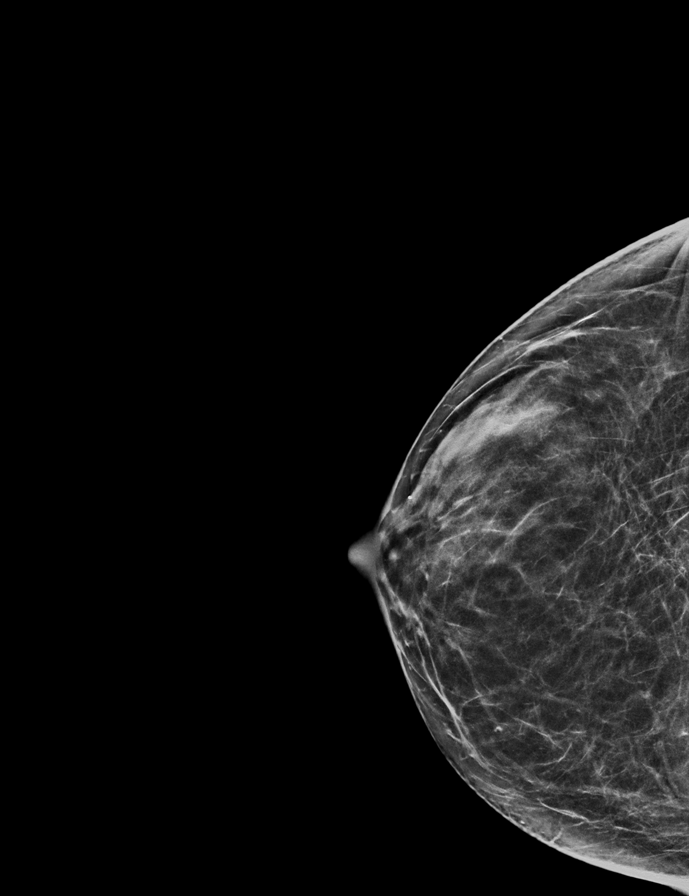

[L MLO synth-2D]
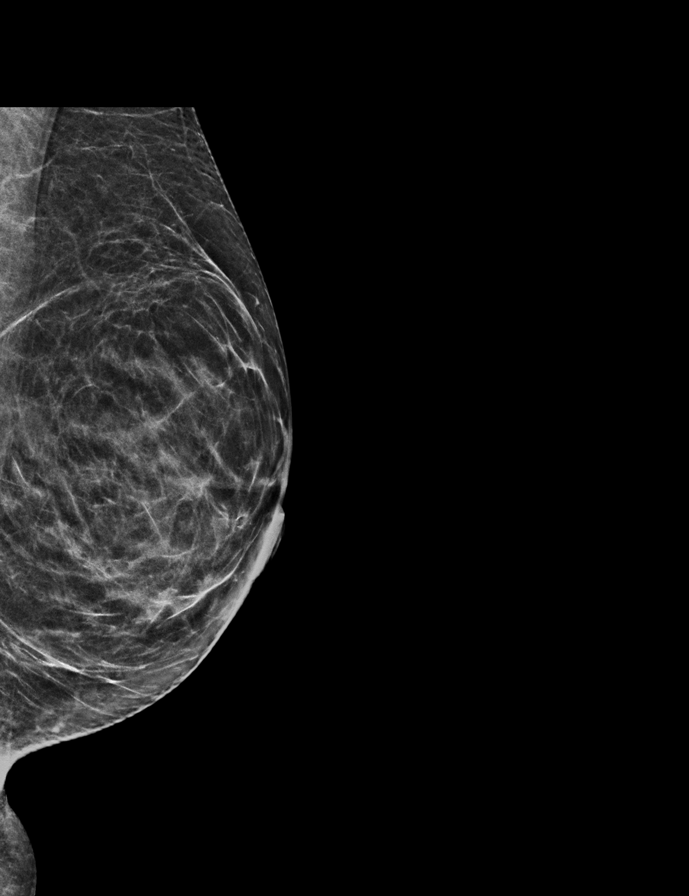

[R MLO synth-2D]
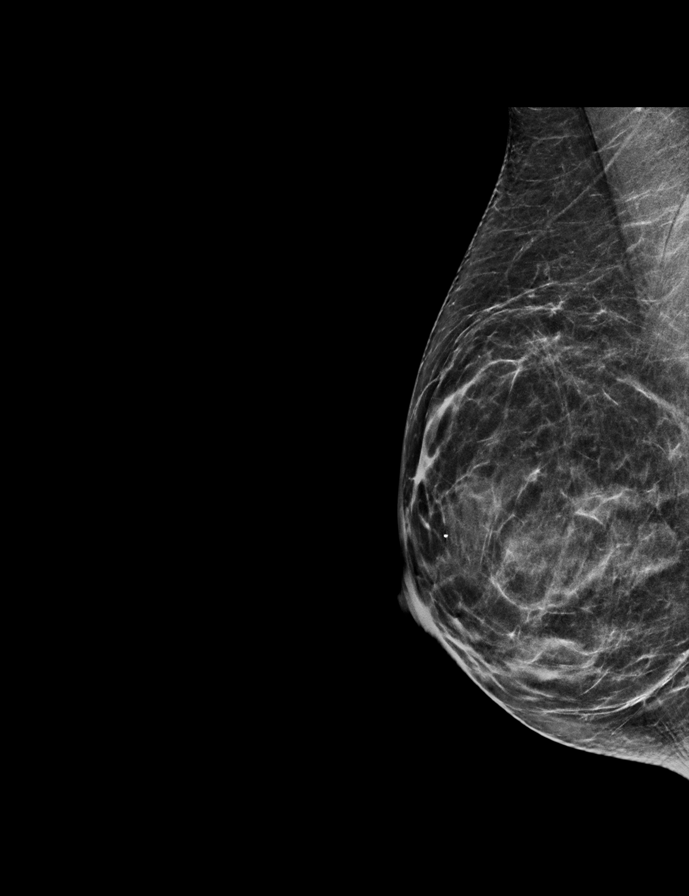

[L MLO tomo · 2 of 54 frames shown]
[frame 18/54]
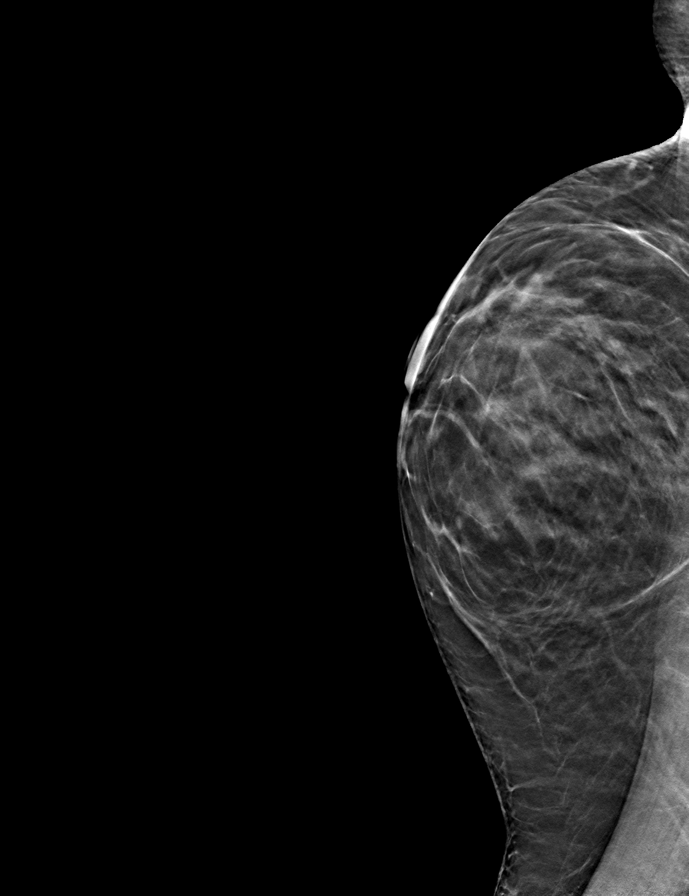
[frame 27/54]
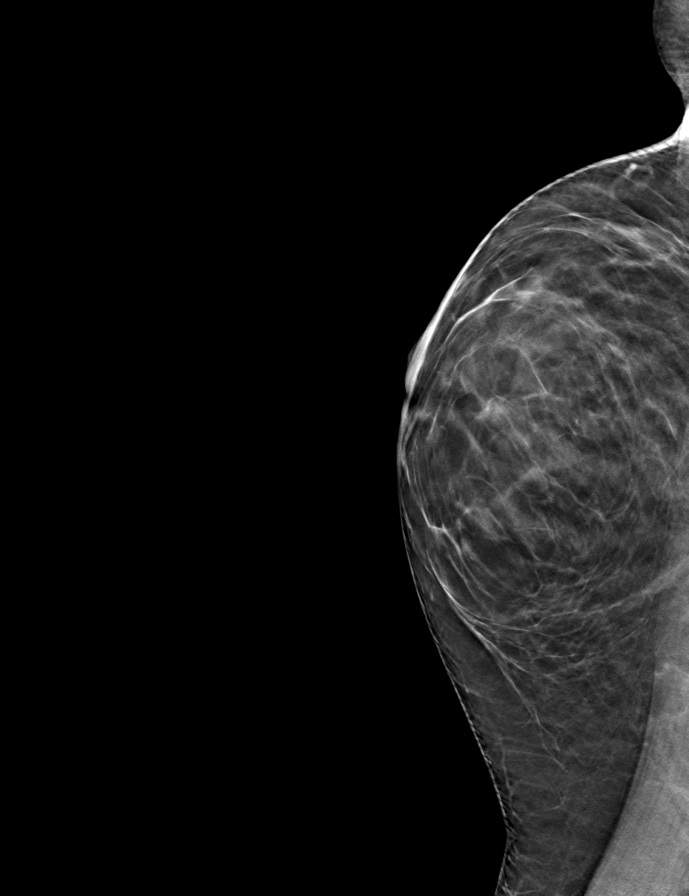

[R CC tomo · tomo slice 28/55.0]
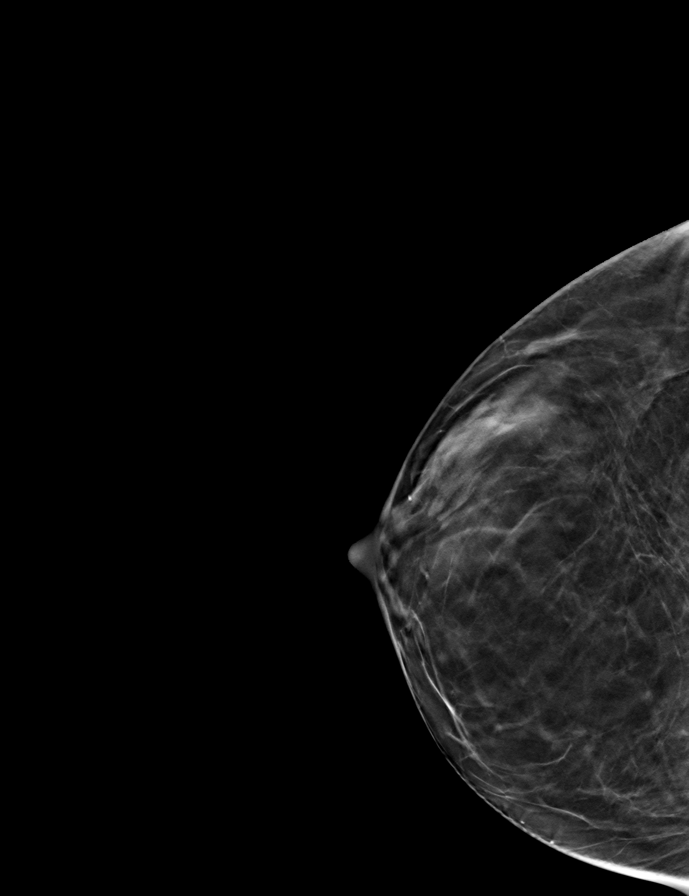

[L CC tomo · tomo slice 32/63.0]
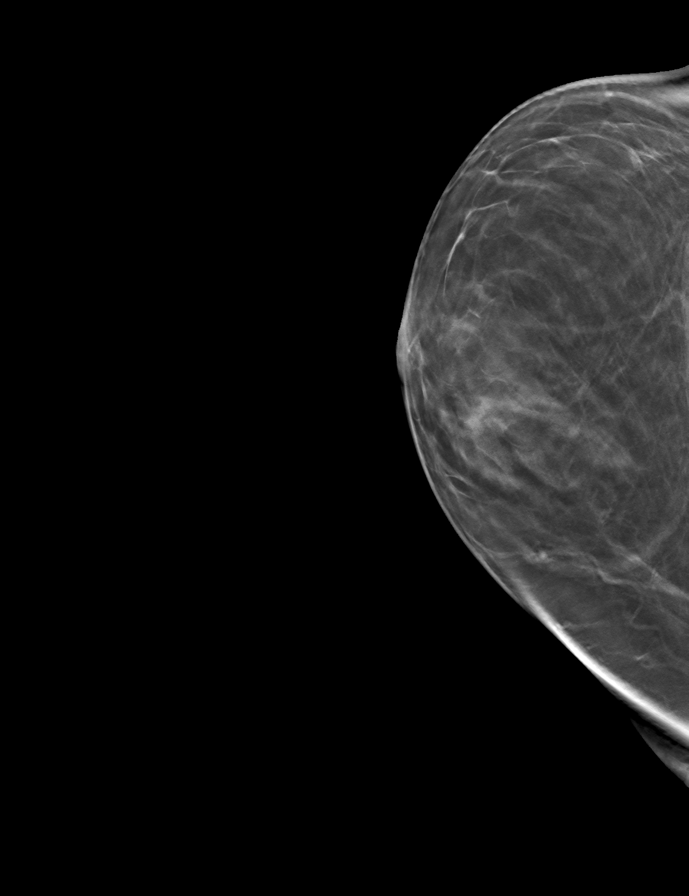

[R MLO tomo · tomo slice 29/57.0]
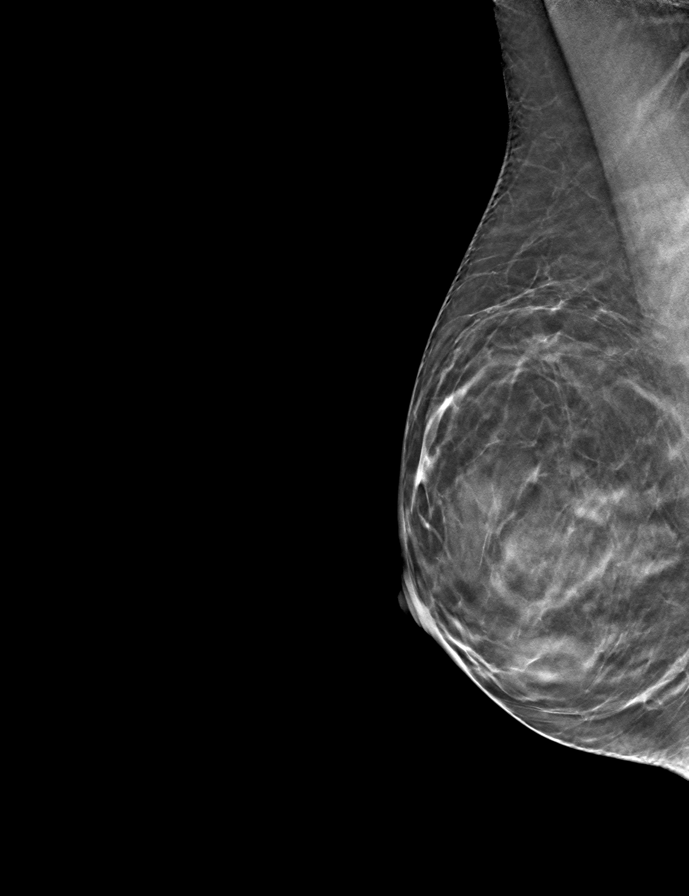

[9 of 24 positions shown; findings below may reference images not displayed]

ACR Breast Density Category c: The breast tissue is heterogeneously
dense, which may obscure small masses.
FINDINGS: There are no findings suspicious for malignancy. Images were
processed with CAD.
IMPRESSION: No mammographic evidence of malignancy. A result letter of this
screening mammogram will be mailed directly to the patient.

RECOMMENDATION:
Screening mammogram in one year. (Code:[id number])

BI-RADS CATEGORY  1: Negative.

## 2021-08-05 ENCOUNTER — Encounter: Payer: Self-pay | Admitting: Family

## 2021-08-05 ENCOUNTER — Ambulatory Visit (INDEPENDENT_AMBULATORY_CARE_PROVIDER_SITE_OTHER): Payer: BC Managed Care – PPO | Admitting: Family

## 2021-08-05 ENCOUNTER — Other Ambulatory Visit: Payer: Self-pay

## 2021-08-05 VITALS — BP 124/72 | HR 70 | Temp 97.5°F | Ht 63.0 in | Wt 127.0 lb

## 2021-08-05 DIAGNOSIS — L719 Rosacea, unspecified: Secondary | ICD-10-CM

## 2021-08-05 DIAGNOSIS — M2550 Pain in unspecified joint: Secondary | ICD-10-CM | POA: Diagnosis not present

## 2021-08-05 DIAGNOSIS — M25441 Effusion, right hand: Secondary | ICD-10-CM | POA: Insufficient documentation

## 2021-08-05 DIAGNOSIS — R21 Rash and other nonspecific skin eruption: Secondary | ICD-10-CM

## 2021-08-05 HISTORY — DX: Effusion, right hand: M25.441

## 2021-08-05 MED ORDER — DOXYCYCLINE HYCLATE 100 MG PO TABS: 100.0000 mg | ORAL_TABLET | Freq: Two times a day (BID) | ORAL | 0 refills | Status: DC

## 2021-08-05 MED ORDER — METRONIDAZOLE 0.75 % EX CREA: TOPICAL_CREAM | Freq: Two times a day (BID) | CUTANEOUS | 1 refills | Status: DC

## 2021-08-05 NOTE — Assessment & Plan Note (Signed)
Pt with M/s, workup with ana/RF ordered, pending results.

## 2021-08-05 NOTE — Assessment & Plan Note (Signed)
Workup for rh/ana, pending results. Conservative measures for now, heat/ice

## 2021-08-05 NOTE — Patient Instructions (Addendum)
Stop by the lab prior to leaving today. I will notify you of your results once received.   I have sent you in a prescription in for doxycycline 100 mg that you will take twice daily for ten days. Also sent in metro-cream which you can apply to your face twice daily, to see if improvement in rash. If no noted improvement at all In the next 3-4 days please let me know.   A referral was placed today for dermatology Please let us know if you have not heard back within 1 week about your referral.  Please pick up and start some over the counter claritin as well once daily.   It was a pleasure seeing you today! Please do not hesitate to reach out with any questions and or concerns.  Regards,   Eugenia Pancoast FNP-C

## 2021-08-05 NOTE — Assessment & Plan Note (Addendum)
rx for metrocream prescribed and also doxycycline 100 mg po bid. Referred to dermatology as well for ongoing evaluation. Handout given in detail on conservative measures. Gave antbx because rash also very close to eyes, so do not want pt administering cream this close to eye so will attempt to provide resolution with doxy.

## 2021-08-05 NOTE — Assessment & Plan Note (Signed)
Itching: pt instructed to take claritin once daily to help with this. Suspected rosacea however may be fungal as dry/scaly. Refer to derm as well as this has been ongoing since November. Rx sent for doxycycline 100 mg po bid x 10 days as well as metrocream for trial. Workup in place as well for lupus, other etiologies.

## 2021-08-05 NOTE — Progress Notes (Signed)
Established Patient Office Visit  Subjective:  Patient ID: Natasha George, female    DOB: [birth year]  Age: 66 y.o. MRN: [id number]  CC:  Chief Complaint  Patient presents with   Rash    HPI Natasha George is here today with concerns.   She had a rash initially back in November of 2022, and saw her PCP virtually, was given bactroban which didn't really help. At this time her rash was only around the corners of her mouth and under her bil eyes. No improvement so 11/17 prednisone was called in and triamincinolone cream, only slight relief. She states since then has only began to worsen. She states does not seem to ever completely go away. She states recently very itchy especially under her right eye. Not currently taking anything for allergies such as antihistamine, and rash continues to spread. Now on bil cheeks, bil corners of lips, and feels tingly sensation/itchiness on back of neck and face.   No new medications, soaps, body washes, lotions, and or new medications.  Only uses dove or cerave  Pt with MS, has been on vumerity since 2020.   Past Medical History:  Diagnosis Date   Chickenpox    Multiple sclerosis ([place])    Rosacea    Seizure ([place])     Past Surgical History:  Procedure Laterality Date   COLONOSCOPY     COLONOSCOPY WITH PROPOFOL N/A 05/27/2020   Procedure: COLONOSCOPY WITH PROPOFOL;  Surgeon: Jonathon Bellows, MD;  Location: [facility] ENDOSCOPY;  Service: Gastroenterology;  Laterality: N/A;   GASTROCNEMIUS RECESSION Left 12/24/2019    Family History  Adopted: Yes  Problem Relation Age of Onset   Breast cancer Neg Hx     Social History   Socioeconomic History   Marital status: Married    Spouse name: Not on file   Number of children: Not on file   Years of education: Not on file   Highest education level: Not on file  Occupational History   Not on file  Tobacco Use   Smoking status: Never   Smokeless tobacco: Never  Vaping Use   Vaping Use: Never  used  Substance and Sexual Activity   Alcohol use: Yes    Alcohol/week: 4.0 standard drinks    Types: 4 Glasses of wine per week   Drug use: Never   Sexual activity: Not on file  Other Topics Concern   Not on file  Social History Narrative   Not on file   Social Determinants of Health   Financial Resource Strain: Not on file  Food Insecurity: Not on file  Transportation Needs: Not on file  Physical Activity: Not on file  Stress: Not on file  Social Connections: Not on file  Intimate Partner Violence: Not on file    Outpatient Medications Prior to Visit  Medication Sig Dispense Refill   Cholecalciferol 125 MCG (5000 UT) TABS cholecalciferol (vitamin D3) 125 mcg (5,000 unit) tablet     levETIRAcetam (KEPPRA) 500 MG tablet Taking 500 mg by mouth in the morning and 1000 mg in the evening     Multiple Vitamins-Minerals (MULTIVITAMIN WOMEN PO) Take by mouth.     triamcinolone cream (KENALOG) 0.1 % Apply 1 application topically 2 (two) times daily. 30 g 0   VUMERITY 231 MG CPDR Take by mouth. Taking 2 tablets by mouth in the am and pm     mupirocin ointment (BACTROBAN) 2 % Apply 1 application topically 2 (two) times daily. 22 g 0  predniSONE (DELTASONE) 20 MG tablet Take two tablets my mouth once daily for four days, then one tablet once daily for four days. 12 tablet 0   No facility-administered medications prior to visit.    No Known Allergies  ROS Review of Systems  Constitutional:  Negative for fatigue.  HENT:  Negative for congestion and ear pain.   Eyes:  Positive for itching (below bil eyes with rash). Negative for pain, discharge and redness.  Musculoskeletal:  Positive for arthralgias (bil hips, bil hands/with joint swelling and left ankle). Joint swelling: with pruritis. Skin:  Positive for rash (facial rash, under bil eyes, bil cheeks, bil corner mouth).     Objective:    Physical Exam Constitutional:      General: She is not in acute distress.    Appearance:  Normal appearance. She is normal weight. She is not ill-appearing, toxic-appearing or diaphoretic.  Cardiovascular:     Rate and Rhythm: Normal rate and regular rhythm.  Pulmonary:     Effort: Pulmonary effort is normal.     Breath sounds: Normal breath sounds.  Skin:    General: Skin is warm.     Comments: Red scaly rash directly below eyes, also with some erythema and scaling on bil cheek bones. Small red circular area on right upper outer lip with scaling  Neurological:     General: No focal deficit present.     Mental Status: She is alert and oriented to person, place, and time.  Psychiatric:        Mood and Affect: Mood normal.        Behavior: Behavior normal.        Thought Content: Thought content normal.        Judgment: Judgment normal.    BP 124/72    Pulse 70    Temp (!) 97.5 F (36.4 C) (Temporal)    Ht 5\' 3"  (1.6 m)    Wt 127 lb (57.6 kg)    SpO2 99%    BMI 22.50 kg/m  Wt Readings from Last 3 Encounters:  08/05/21 127 lb (57.6 kg)  05/24/21 122 lb (55.3 kg)  05/27/20 122 lb (55.3 kg)     Health Maintenance Due  Topic Date Due   Zoster Vaccines- Shingrix (1 of 2) Never done   PAP SMEAR-Modifier  02/16/2020   COVID-19 Vaccine (4 - Booster for Pfizer series) 08/13/2020   TETANUS/TDAP  09/20/2020    There are no preventive care reminders to display for this patient.  Lab Results  Component Value Date   TSH 3.40 04/29/2018   Lab Results  Component Value Date   WBC 5.1 08/05/2021   HGB 13.9 08/05/2021   HCT 41.0 08/05/2021   MCV 93.4 08/05/2021   PLT 265 08/05/2021   Lab Results  Component Value Date   NA 137 04/29/2020   K 4.5 04/29/2020   CO2 30 04/29/2020   GLUCOSE 87 04/29/2020   BUN 11 04/29/2020   CREATININE 0.53 04/29/2020   BILITOT 0.8 04/29/2020   ALKPHOS 51 04/29/2020   AST 18 04/29/2020   ALT 17 04/29/2020   PROT 7.0 04/29/2020   ALBUMIN 4.6 04/29/2020   CALCIUM 9.4 04/29/2020   GFR 101.57 04/29/2020   Lab Results  Component  Value Date   HGBA1C 5.4 04/29/2018      Assessment & Plan:   Problem List Items Addressed This Visit       Musculoskeletal and Integument   Rosacea    rx for  metrocream prescribed and also doxycycline 100 mg po bid. Referred to dermatology as well for ongoing evaluation. Handout given in detail on conservative measures. Gave antbx because rash also very close to eyes, so do not want pt administering cream this close to eye so will attempt to provide resolution with doxy.      Relevant Medications   metroNIDAZOLE (METROCREAM) 0.75 % cream   doxycycline (VIBRA-TABS) 100 MG tablet   Facial rash - Primary    Itching: pt instructed to take claritin once daily to help with this. Suspected rosacea however may be fungal as dry/scaly. Refer to derm as well as this has been ongoing since November. Rx sent for doxycycline 100 mg po bid x 10 days as well as metrocream for trial. Workup in place as well for lupus, other etiologies.       Relevant Medications   metroNIDAZOLE (METROCREAM) 0.75 % cream   doxycycline (VIBRA-TABS) 100 MG tablet   Other Relevant Orders   CBC w/Diff (Completed)   Sedimentation rate (Completed)   ANA (Completed)   Ambulatory referral to Dermatology   Rheumatoid Factor (Completed)     Other   Swelling of hand joint, right    Workup for rh/ana, pending results. Conservative measures for now, heat/ice      Relevant Orders   Rheumatoid Factor (Completed)   Polyarthralgia    Pt with M/s, workup with ana/RF ordered, pending results.       Relevant Orders   Rheumatoid Factor (Completed)    Meds ordered this encounter  Medications   metroNIDAZOLE (METROCREAM) 0.75 % cream    Sig: Apply topically 2 (two) times daily for 7 days.    Dispense:  45 g    Refill:  1    Order Specific Question:   Supervising Provider    Answer:   BEDSOLE, AMY E [[id number]]   doxycycline (VIBRA-TABS) 100 MG tablet    Sig: Take 1 tablet (100 mg total) by mouth 2 (two) times daily for 10  days.    Dispense:  20 tablet    Refill:  0    Order Specific Question:   Supervising Provider    Answer:   BEDSOLE, AMY E [[id number]]    Follow-up: Return if symptoms worsen or fail to improve.    Eugenia Pancoast, FNP

## 2021-08-08 LAB — CBC WITH DIFFERENTIAL/PLATELET
Absolute Monocytes: 362 cells/uL (ref 200–950)
Basophils Absolute: 41 cells/uL (ref 0–200)
Basophils Relative: 0.8 %
Eosinophils Absolute: 112 cells/uL (ref 15–500)
Eosinophils Relative: 2.2 %
HCT: 41 % (ref 35.0–45.0)
Hemoglobin: 13.9 g/dL (ref 11.7–15.5)
Lymphs Abs: 1209 cells/uL (ref 850–3900)
MCH: 31.7 pg (ref 27.0–33.0)
MCHC: 33.9 g/dL (ref 32.0–36.0)
MCV: 93.4 fL (ref 80.0–100.0)
MPV: 9.5 fL (ref 7.5–12.5)
Monocytes Relative: 7.1 %
Neutro Abs: 3376 cells/uL (ref 1500–7800)
Neutrophils Relative %: 66.2 %
Platelets: 265 10*3/uL (ref 140–400)
RBC: 4.39 10*6/uL (ref 3.80–5.10)
RDW: 12.2 % (ref 11.0–15.0)
Total Lymphocyte: 23.7 %
WBC: 5.1 10*3/uL (ref 3.8–10.8)

## 2021-08-08 LAB — ANTI-NUCLEAR AB-TITER (ANA TITER)
ANA TITER: 1:1280 {titer} — ABNORMAL HIGH
ANA Titer 1: 1:40 {titer} — ABNORMAL HIGH

## 2021-08-08 LAB — SEDIMENTATION RATE: Sed Rate: 11 mm/h (ref 0–30)

## 2021-08-08 LAB — ANA: Anti Nuclear Antibody (ANA): POSITIVE — AB

## 2021-08-08 LAB — RHEUMATOID FACTOR: Rheumatoid fact SerPl-aCnc: <14 IU/mL (ref <14)

## 2021-08-09 ENCOUNTER — Encounter: Payer: Self-pay | Admitting: Family

## 2021-08-09 DIAGNOSIS — T7840XA Allergy, unspecified, initial encounter: Secondary | ICD-10-CM

## 2021-08-09 MED ORDER — METHYLPREDNISOLONE 4 MG PO TBPK: ORAL_TABLET | ORAL | 0 refills | Status: DC

## 2021-08-09 NOTE — Telephone Encounter (Signed)
Called and spoke with patient and advised her to stop the doxycycline as well as the metronidazole.  I will add these to her allergies for now.  I am not sure but it does sound like she either had an allergy to either 1 of these 2 medications.  Advised patient to call the dermatology office and request a dermatology referral as her husband also was seen with this office.  If she runs into any issues she is advised to call me and let me know and I will attempt to help to expedite the referral as quickly as possible.  I have sent in a steroid prescription for patient to start for allergic reaction.  If any shortness of breath or difficulty swallowing patient to go to the emergency room.  Patient stable at current

## 2021-09-02 ENCOUNTER — Encounter: Payer: BC Managed Care – PPO | Admitting: Primary Care

## 2021-09-20 DIAGNOSIS — D849 Immunodeficiency, unspecified: Secondary | ICD-10-CM | POA: Insufficient documentation

## 2021-10-06 ENCOUNTER — Other Ambulatory Visit: Payer: Self-pay

## 2021-10-06 ENCOUNTER — Encounter: Payer: Self-pay | Admitting: Primary Care

## 2021-10-06 ENCOUNTER — Ambulatory Visit (INDEPENDENT_AMBULATORY_CARE_PROVIDER_SITE_OTHER): Payer: BC Managed Care – PPO | Admitting: Primary Care

## 2021-10-06 VITALS — BP 122/74 | HR 62 | Temp 98.6°F | Ht 63.0 in | Wt 126.0 lb

## 2021-10-06 DIAGNOSIS — R21 Rash and other nonspecific skin eruption: Secondary | ICD-10-CM

## 2021-10-06 DIAGNOSIS — G40909 Epilepsy, unspecified, not intractable, without status epilepticus: Secondary | ICD-10-CM | POA: Diagnosis not present

## 2021-10-06 DIAGNOSIS — G35 Multiple sclerosis: Secondary | ICD-10-CM | POA: Diagnosis not present

## 2021-10-06 DIAGNOSIS — Z0000 Encounter for general adult medical examination without abnormal findings: Secondary | ICD-10-CM | POA: Diagnosis not present

## 2021-10-06 DIAGNOSIS — E785 Hyperlipidemia, unspecified: Secondary | ICD-10-CM

## 2021-10-06 DIAGNOSIS — Z1231 Encounter for screening mammogram for malignant neoplasm of breast: Secondary | ICD-10-CM

## 2021-10-06 NOTE — Assessment & Plan Note (Signed)
Discussed the importance of a healthy diet and regular exercise in order for weight loss, and to reduce the risk of further co-morbidity.  Repeat lipid panel pending. 

## 2021-10-06 NOTE — Assessment & Plan Note (Signed)
Shingrix and tetanus due, she declines today but will schedule nurse visits in the future. ? ?Mammogram due, ordered today. ?Colonoscopy up-to-date, due 2024. ?Pap smear due, offered today, she currently declines but will set up with GYN. ? ?Encouraged healthy diet and regular exercise ? ?Exam today stable, labs pending. ? ?

## 2021-10-06 NOTE — Progress Notes (Signed)
? ?Subjective:  ? ? Patient ID: Natasha George, female    DOB: [birth year], 66 y.o.   MRN: [id number] ? ?HPI ? ?Natasha George is a very pleasant 66 y.o. female who presents today for complete physical and follow up of chronic conditions. ? ?Immunizations: ?-Tetanus: 2012 ?-Influenza: Completed this season  ?-Covid-19: 3 vaccines  ?-Shingles: Has not completed  ? ?Diet: Fair diet.  ?Exercise: No regular exercise. ? ?Eye exam: Completes annually  ?Dental exam: Completes semi-annually  ? ?Pap Smear: Completed over three years ago.  ?Mammogram: Completed in January 2022 ?Colonoscopy: Completed in 2021, due 2024 ? ? ?BP Readings from Last 3 Encounters:  ?10/06/21 122/74  ?08/05/21 124/72  ?05/27/20 120/76  ? ? ? ? ?Review of Systems  ?Constitutional:  Negative for unexpected weight change.  ?HENT:  Negative for rhinorrhea.   ?Respiratory:  Negative for shortness of breath.   ?Cardiovascular:  Negative for chest pain.  ?Gastrointestinal:  Negative for constipation and diarrhea.  ?Genitourinary:  Negative for difficulty urinating.  ?Musculoskeletal:  Negative for arthralgias.  ?     Chronic left foot pain  ?Skin:  Positive for rash.  ?Allergic/Immunologic: Negative for environmental allergies.  ?Neurological:  Negative for dizziness, numbness and headaches.  ?Psychiatric/Behavioral:  The patient is not nervous/anxious.   ? ?   ? ? ?Past Medical History:  ?Diagnosis Date  ? Chickenpox   ? Multiple sclerosis ([place])   ? Rosacea   ? Seizure ([place])   ? ? ?Social History  ? ?Socioeconomic History  ? Marital status: Married  ?  Spouse name: Not on file  ? Number of children: Not on file  ? Years of education: Not on file  ? Highest education level: Not on file  ?Occupational History  ? Not on file  ?Tobacco Use  ? Smoking status: Never  ? Smokeless tobacco: Never  ?Vaping Use  ? Vaping Use: Never used  ?Substance and Sexual Activity  ? Alcohol use: Yes  ?  Alcohol/week: 4.0 standard drinks  ?  Types: 4 Glasses of wine per  week  ? Drug use: Never  ? Sexual activity: Not on file  ?Other Topics Concern  ? Not on file  ?Social History Narrative  ? Not on file  ? ?Social Determinants of Health  ? ?Financial Resource Strain: Not on file  ?Food Insecurity: Not on file  ?Transportation Needs: Not on file  ?Physical Activity: Not on file  ?Stress: Not on file  ?Social Connections: Not on file  ?Intimate Partner Violence: Not on file  ? ? ?Past Surgical History:  ?Procedure Laterality Date  ? COLONOSCOPY    ? COLONOSCOPY WITH PROPOFOL N/A 05/27/2020  ? Procedure: COLONOSCOPY WITH PROPOFOL;  Surgeon: Jonathon Bellows, MD;  [facility] ENDOSCOPY;  Service: Gastroenterology;  Laterality: N/A;  ? GASTROCNEMIUS RECESSION Left 12/24/2019  ? ? ?Family History  ?Adopted: Yes  ?Problem Relation Age of Onset  ? Breast cancer Neg Hx   ? ? ?Allergies  ?Allergen Reactions  ? Doxycycline Rash  ?  Lip swelling/burning sensation ?No sob  ?  ? Metronidazole Itching  ? ? ?Current Outpatient Medications on File Prior to Visit  ?Medication Sig Dispense Refill  ? Cholecalciferol 125 MCG (5000 UT) TABS cholecalciferol (vitamin D3) 125 mcg (5,000 unit) tablet    ? levETIRAcetam (KEPPRA) 500 MG tablet Taking 500 mg by mouth in the morning and 1000 mg in the evening    ? Multiple Vitamins-Minerals (MULTIVITAMIN WOMEN PO) Take by mouth.    ?  VUMERITY 231 MG CPDR Take by mouth. Taking 2 tablets by mouth in the am and pm    ? ?No current facility-administered medications on file prior to visit.  ? ? ?BP 122/74   Pulse 62   Temp 98.6 ?F (37 ?C) (Oral)   Ht '5\' 3"'$  (1.6 m)   Wt 126 lb (57.2 kg)   SpO2 97%   BMI 22.32 kg/m?  ?Objective:  ? Physical Exam ?HENT:  ?   Right Ear: Tympanic membrane and ear canal normal.  ?   Left Ear: Tympanic membrane and ear canal normal.  ?   Nose: Nose normal.  ?Eyes:  ?   Conjunctiva/sclera: Conjunctivae normal.  ?   Pupils: Pupils are equal, round, and reactive to light.  ?Neck:  ?   Thyroid: No thyromegaly.  ?Cardiovascular:  ?   Rate  and Rhythm: Normal rate and regular rhythm.  ?   Heart sounds: No murmur heard. ?Pulmonary:  ?   Effort: Pulmonary effort is normal.  ?   Breath sounds: Normal breath sounds. No rales.  ?Abdominal:  ?   General: Bowel sounds are normal.  ?   Palpations: Abdomen is soft.  ?   Tenderness: There is no abdominal tenderness.  ?Musculoskeletal:     ?   General: Normal range of motion.  ?   Cervical back: Neck supple.  ?Lymphadenopathy:  ?   Cervical: No cervical adenopathy.  ?Skin: ?   General: Skin is warm and dry.  ?   Findings: No rash.  ?Neurological:  ?   Mental Status: She is alert and oriented to person, place, and time.  ?   Cranial Nerves: No cranial nerve deficit.  ?   Deep Tendon Reflexes: Reflexes are normal and symmetric.  ?Psychiatric:     ?   Mood and Affect: Mood normal.  ? ? ? ? ? ?   ?Assessment & Plan:  ? ? ? ? ?This visit occurred during the SARS-CoV-2 public health emergency.  Safety protocols were in place, including screening questions prior to the visit, additional usage of staff PPE, and extensive cleaning of exam room while observing appropriate contact time as indicated for disinfecting solutions.  ?

## 2021-10-06 NOTE — Patient Instructions (Signed)
Stop by the lab prior to leaving today. I will notify you of your results once received.  ° °Call the [facility] to schedule your mammogram.  ° °It was a pleasure to see you today! ° °Preventive Care 64-64 Years Old, Female °Preventive care refers to lifestyle choices and visits with your health care provider that can promote health and wellness. Preventive care visits are also called wellness exams. °What can I expect for my preventive care visit? °Counseling °Your health care provider may ask you questions about your: °Medical history, including: °Past medical problems. °Family medical history. °Pregnancy history. °Current health, including: °Menstrual cycle. °Method of birth control. °Emotional well-being. °Home life and relationship well-being. °Sexual activity and sexual health. °Lifestyle, including: °Alcohol, nicotine or tobacco, and drug use. °Access to firearms. °Diet, exercise, and sleep habits. °Work and work environment. °Sunscreen use. °Safety issues such as seatbelt and bike helmet use. °Physical exam °Your health care provider will check your: °Height and weight. These may be used to calculate your BMI (body mass index). BMI is a measurement that tells if you are at a healthy weight. °Waist circumference. This measures the distance around your waistline. This measurement also tells if you are at a healthy weight and may help predict your risk of certain diseases, such as type 2 diabetes and high blood pressure. °Heart rate and blood pressure. °Body temperature. °Skin for abnormal spots. °What immunizations do I need? °Vaccines are usually given at various ages, according to a schedule. Your health care provider will recommend vaccines for you based on your age, medical history, and lifestyle or other factors, such as travel or where you work. °What tests do I need? °Screening °Your health care provider may recommend screening tests for certain conditions. This may include: °Lipid and cholesterol  levels. °Diabetes screening. This is done by checking your blood sugar (glucose) after you have not eaten for a while (fasting). °Pelvic exam and Pap test. °Hepatitis B test. °Hepatitis C test. °HIV (human immunodeficiency virus) test. °STI (sexually transmitted infection) testing, if you are at risk. °Lung cancer screening. °Colorectal cancer screening. °Mammogram. Talk with your health care provider about when you should start having regular mammograms. This may depend on whether you have a family history of breast cancer. °BRCA-related cancer screening. This may be done if you have a family history of breast, ovarian, tubal, or peritoneal cancers. °Bone density scan. This is done to screen for osteoporosis. °Talk with your health care provider about your test results, treatment options, and if necessary, the need for more tests. °Follow these instructions at home: °Eating and drinking ° °Eat a diet that includes fresh fruits and vegetables, whole grains, lean protein, and low-fat dairy products. °Take vitamin and mineral supplements as recommended by your health care provider. °Do not drink alcohol if: °Your health care provider tells you not to drink. °You are pregnant, may be pregnant, or are planning to become pregnant. °If you drink alcohol: °Limit how much you have to 64-64 drink a day. °Know how much alcohol is in your drink. In the [place] one drink equals one 12 oz bottle of beer (355 mL), one 5 oz glass of wine (148 mL), or one 1½ oz glass of hard liquor (44 mL). °Lifestyle °Brush your teeth every morning and night with fluoride toothpaste. Floss one time each day. °Exercise for at least 30 minutes 5 or more days each week. °Do not use any products that contain nicotine or tobacco. These products include cigarettes, chewing tobacco,   and vaping devices, such as e-cigarettes. If you need help quitting, ask your health care provider. °Do not use drugs. °If you are sexually active, practice safe sex. Use a  condom or other form of protection to prevent STIs. °If you do not wish to become pregnant, use a form of birth control. If you plan to become pregnant, see your health care provider for a prepregnancy visit. °Take aspirin only as told by your health care provider. Make sure that you understand how much to take and what form to take. Work with your health care provider to find out whether it is safe and beneficial for you to take aspirin daily. °Find healthy ways to manage stress, such as: °Meditation, yoga, or listening to music. °Journaling. °Talking to a trusted person. °Spending time with friends and family. °Minimize exposure to UV radiation to reduce your risk of skin cancer. °Safety °Always wear your seat belt while driving or riding in a vehicle. °Do not drive: °If you have been drinking alcohol. Do not ride with someone who has been drinking. °When you are tired or distracted. °While texting. °If you have been using any mind-altering substances or drugs. °Wear a helmet and other protective equipment during sports activities. °If you have firearms in your house, make sure you follow all gun safety procedures. °Seek help if you have been physically or sexually abused. °What's next? °Visit your health care provider once a year for an annual wellness visit. °Ask your health care provider how often you should have your eyes and teeth checked. °Stay up to date on all vaccines. °This information is not intended to replace advice given to you by your health care provider. Make sure you discuss any questions you have with your health care provider. °Document Revised: 12/29/2020 Document Reviewed: 12/29/2020 °Elsevier Patient Education © 2022 [employer] Inc. ° °

## 2021-10-06 NOTE — Assessment & Plan Note (Signed)
No seizure activity. ?Continue Keppra 500 mg BID. ?

## 2021-10-06 NOTE — Assessment & Plan Note (Signed)
Following with Neurology in [place]. ?Stable. ? ?Continue Vumerity 231 mg, 2 tabs BID. ? ?Reviewed office notes from March 2023. ?

## 2021-10-06 NOTE — Assessment & Plan Note (Signed)
Mostly resolved. ?Following with dermatology.  ? ?Continue topical steroid cream PRN.  ?

## 2021-10-07 LAB — LIPID PANEL
Cholesterol: 208 mg/dL — ABNORMAL HIGH (ref 0–200)
HDL: 86.3 mg/dL (ref >39.00)
LDL Cholesterol: 113 mg/dL — ABNORMAL HIGH (ref 0–99)
NonHDL: 121.4
Total CHOL/HDL Ratio: 2
Triglycerides: 43 mg/dL (ref 0.0–149.0)
VLDL: 8.6 mg/dL (ref 0.0–40.0)

## 2021-10-07 LAB — COMPREHENSIVE METABOLIC PANEL
ALT: 17 U/L (ref 0–35)
AST: 21 U/L (ref 0–37)
Albumin: 4.6 g/dL (ref 3.5–5.2)
Alkaline Phosphatase: 54 U/L (ref 39–117)
BUN: 14 mg/dL (ref 6–23)
CO2: 30 mEq/L (ref 19–32)
Calcium: 9 mg/dL (ref 8.4–10.5)
Chloride: 101 mEq/L (ref 96–112)
Creatinine, Ser: 0.51 mg/dL (ref 0.40–1.20)
GFR: 99.15 mL/min (ref >60.00)
Glucose, Bld: 77 mg/dL (ref 70–99)
Potassium: 4.1 mEq/L (ref 3.5–5.1)
Sodium: 136 mEq/L (ref 135–145)
Total Bilirubin: 0.8 mg/dL (ref 0.2–1.2)
Total Protein: 6.5 g/dL (ref 6.0–8.3)

## 2021-11-15 ENCOUNTER — Ambulatory Visit
Admission: RE | Admit: 2021-11-15 | Discharge: 2021-11-15 | Disposition: A | Discharge status: Home / Self Care | Payer: BC Managed Care – PPO | Source: Ambulatory Visit | Attending: Primary Care | Admitting: Primary Care

## 2021-11-15 DIAGNOSIS — Z1231 Encounter for screening mammogram for malignant neoplasm of breast: Secondary | ICD-10-CM | POA: Insufficient documentation

## 2022-04-05 ENCOUNTER — Encounter: Payer: Self-pay | Admitting: Obstetrics and Gynecology

## 2022-04-05 ENCOUNTER — Ambulatory Visit (INDEPENDENT_AMBULATORY_CARE_PROVIDER_SITE_OTHER): Payer: BC Managed Care – PPO | Admitting: Obstetrics and Gynecology

## 2022-04-05 ENCOUNTER — Other Ambulatory Visit (HOSPITAL_COMMUNITY)
Admission: RE | Admit: 2022-04-05 | Discharge: 2022-04-05 | Disposition: A | Discharge status: Home / Self Care | Payer: BC Managed Care – PPO | Source: Ambulatory Visit | Attending: Obstetrics and Gynecology | Admitting: Obstetrics and Gynecology

## 2022-04-05 VITALS — BP 138/81 | HR 68 | Ht 63.0 in | Wt 124.0 lb

## 2022-04-05 DIAGNOSIS — N952 Postmenopausal atrophic vaginitis: Secondary | ICD-10-CM | POA: Diagnosis not present

## 2022-04-05 DIAGNOSIS — Z124 Encounter for screening for malignant neoplasm of cervix: Secondary | ICD-10-CM

## 2022-04-05 DIAGNOSIS — Z01419 Encounter for gynecological examination (general) (routine) without abnormal findings: Secondary | ICD-10-CM | POA: Insufficient documentation

## 2022-04-05 NOTE — Progress Notes (Signed)
Obstetrics and Gynecology New Patient Evaluation  Appointment Date: 04/05/2022  OBGYN Clinic: [facility]   Primary Care Provider: Pleas Koch  Referring Provider: Pleas Koch, NP  Chief Complaint: Annual exam   History of Present Illness: Natasha George is a 66 y.o. [phone] (No LMP recorded. Patient is postmenopausal.), seen for the above chief complaint.   Patient last pap in 2018 so here for routine pap surveillance.   Review of Systems: A comprehensive review of systems was negative.   Patient Active Problem List   Diagnosis Date Noted   Swelling of hand joint, right 08/05/2021   Polyarthralgia 08/05/2021   Facial rash 05/24/2021   Hyperlipidemia 05/06/2019   Elevated LFTs 05/06/2019   Chronic left hip pain 05/01/2018   Preventative health care 05/01/2018   Multiple sclerosis ([place]) 02/21/2018   Seizure disorder ([place]) 02/21/2018   Past Medical History:  Past Medical History:  Diagnosis Date   Chickenpox    Multiple sclerosis ([place])    Rosacea    Seizure ([place])    Past Surgical History:  Past Surgical History:  Procedure Laterality Date   COLONOSCOPY     COLONOSCOPY WITH PROPOFOL N/A 05/27/2020   Procedure: COLONOSCOPY WITH PROPOFOL;  Surgeon: Jonathon Bellows, MD;  Location[facility] ENDOSCOPY;  Service: Gastroenterology;  Laterality: N/A;   GASTROCNEMIUS RECESSION Left 12/24/2019    Past Obstetrical History:  OB History  Gravida Para Term Preterm AB Living  '2 2 2     2  '$ SAB IAB Ectopic Multiple Live Births          2    # Outcome Date GA Lbr Len/2nd Weight Sex Delivery Anes PTL Lv  2 Term 02/17/91    M Vag-Spont  N LIV  1 Term 02/23/86    Gus Puma LIV    Past Gynecological History: As per HPI. History of Pap Smear(s): Yes.   Last pap 2018, which was wnl per patent LMP: mid 77s History of HRT use: No.  Social History:  Social History   Socioeconomic History   Marital status: Married    Spouse name:  Not on file   Number of children: Not on file   Years of education: Not on file   Highest education level: Not on file  Occupational History   Not on file  Tobacco Use   Smoking status: Never   Smokeless tobacco: Never  Vaping Use   Vaping Use: Never used  Substance and Sexual Activity   Alcohol use: Yes    Alcohol/week: 4.0 standard drinks of alcohol    Types: 4 Glasses of wine per week    Comment: socially   Drug use: Never   Sexual activity: Yes    Partners: Male    Comment: Married  Other Topics Concern   Not on file  Social History Narrative   Not on file   Social Determinants of Health   Financial Resource Strain: Not on file  Food Insecurity: Not on file  Transportation Needs: Not on file  Physical Activity: Not on file  Stress: Not on file  Social Connections: Not on file  Intimate Partner Violence: Not on file    Family History:  Family History  Adopted: Yes  Problem Relation Age of Onset   Breast cancer Neg Hx     Health Maintenance:  Mammogram(s): Yes.   Date: May 2023 Colonoscopy: Yes.   Date: Nov 2021>three year repeat recommended  Medications Whittney A. Auten had  no medications administered during this visit. Current Outpatient Medications  Medication Sig Dispense Refill   Cholecalciferol 125 MCG (5000 UT) TABS cholecalciferol (vitamin D3) 125 mcg (5,000 unit) tablet     doxycycline (VIBRA-TABS) 100 MG tablet Take 100 mg by mouth 2 (two) times daily.     levETIRAcetam (KEPPRA) 500 MG tablet Taking 500 mg by mouth in the morning and 1000 mg in the evening     Multiple Vitamins-Minerals (MULTIVITAMIN WOMEN PO) Take by mouth.     VUMERITY 231 MG CPDR Take by mouth. Taking 2 tablets by mouth in the am and pm     No current facility-administered medications for this visit.    Allergies Doxycycline and Metronidazole   Physical Exam:  BP 138/81   Pulse 68   Ht '5\' 3"'$  (1.6 m)   Wt 124 lb (56.2 kg)   BMI 21.97 kg/m  Body mass index is  21.97 kg/m. General appearance: Well nourished, well developed female in no acute distress.  Cardiovascular: normal s1 and s2.  No murmurs, rubs or gallops. Respiratory:  Clear to auscultation bilateral. Normal respiratory effort Abdomen: positive bowel sounds and no masses, hernias; diffusely non tender to palpation, non distended Breasts: breasts appear normal, no suspicious masses, no skin or nipple changes or axillary nodes, and normal palpation. Neuro/Psych:  Normal mood and affect.  Skin:  Warm and dry.  Lymphatic:  No inguinal lymphadenopathy.   Pelvic exam: is not limited by body habitus EGBUS: within normal limits, moderate atrophy Vagina: within normal limits and with no blood or discharge in the vault. Moderate atrophy Cervix: normal appearing cervix without tenderness, discharge or lesions. Moderate to severe atrophy Uterus:  nonenlarged and non tender Adnexa:  normal adnexa and no mass, fullness, tenderness Rectovaginal: deferred  Laboratory: none  Radiology: none  Assessment: patient doing well  Plan:  1. Cervical cancer screening Patient in long term marriage, monogamous and no history of prior abnormal pap smears. I told her that if this is negative, she can repeat another in five years and if still negative, can stop at that point - Cytology - PAP  2. Well woman exam with routine gynecological exam - Cytology - PAP  3. Vaginal atrophy Vaginal moisturizer suggestions handout given and lubricants recommended as well.   RTC PRN  Durene Romans MD Attending Center for [employer] [facility])

## 2022-04-05 NOTE — Progress Notes (Signed)
NGYN here for Annual  Last Pap: 02/15/2017 WNL  Mammogram:11/15/21  CC: None

## 2022-04-11 LAB — CYTOLOGY - PAP
Comment: NEGATIVE
Diagnosis: NEGATIVE
Diagnosis: REACTIVE
High risk HPV: NEGATIVE

## 2022-05-02 ENCOUNTER — Telehealth: Payer: Self-pay | Admitting: Primary Care

## 2022-05-02 NOTE — Telephone Encounter (Signed)
Patient called and stated she made an appointment at [facility] in [place] on tomorrow and wanted to know if she needed a referral for the appointment. Call back number [phone].

## 2022-05-02 NOTE — Telephone Encounter (Signed)
Patient notified she shouldn't need a referral. Will call with any further questions.

## 2022-05-03 ENCOUNTER — Ambulatory Visit (INDEPENDENT_AMBULATORY_CARE_PROVIDER_SITE_OTHER): Payer: BC Managed Care – PPO | Admitting: Podiatry

## 2022-05-03 ENCOUNTER — Encounter: Payer: Self-pay | Admitting: Podiatry

## 2022-05-03 ENCOUNTER — Ambulatory Visit (INDEPENDENT_AMBULATORY_CARE_PROVIDER_SITE_OTHER): Payer: BC Managed Care – PPO

## 2022-05-03 DIAGNOSIS — M778 Other enthesopathies, not elsewhere classified: Secondary | ICD-10-CM

## 2022-05-03 DIAGNOSIS — R531 Weakness: Secondary | ICD-10-CM | POA: Diagnosis not present

## 2022-05-03 NOTE — Progress Notes (Signed)
Subjective:  Patient ID: Natasha George, female    DOB: 11-Sep-1955,  MRN: [id number] HPI Chief Complaint  Patient presents with   Foot Pain    Left foot - jammed hallux left in 2018, noticed increasing weakness, pain in foot , leg and hip, [facility] - tried PT, dry needling, feels like lost control of foot, sometimes can't pick foot up to walk, toes callused due to HT, shoes wearing out a lot to one side   New Patient (Initial Visit)    66 y.o. female presents with the above complaint.   ROS: Denies fever chills nausea vomiting muscle aches pains calf pain back pain chest pain shortness of breath.  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  She states that physical therapy really got her foot back to normal but she still lacks good propulsion.  Her husband states that she will walk perfectly at times and then in the same timeframe she will walk abnormally.  Has seen a neurologist in [place] whom she really did not get along with it was provided no information.  Past Medical History:  Diagnosis Date   Chickenpox    Multiple sclerosis ([place])    Rosacea    Seizure ([place])    Past Surgical History:  Procedure Laterality Date   COLONOSCOPY     COLONOSCOPY WITH PROPOFOL N/A 05/27/2020   Procedure: COLONOSCOPY WITH PROPOFOL;  Surgeon: Jonathon Bellows, MD;  Location: [facility] ENDOSCOPY;  Service: Gastroenterology;  Laterality: N/A;   GASTROCNEMIUS RECESSION Left 12/24/2019    Current Outpatient Medications:    Cholecalciferol 125 MCG (5000 UT) TABS, cholecalciferol (vitamin D3) 125 mcg (5,000 unit) tablet, Disp: , Rfl:    Clocortolone Pivalate (CLODERM) 0.1 % cream, Apply topically., Disp: , Rfl:    levETIRAcetam (KEPPRA) 500 MG tablet, Taking 500 mg by mouth in the morning and 1000 mg in the evening, Disp: , Rfl:    Multiple Vitamins-Minerals (MULTIVITAMIN WOMEN PO), Take by  mouth., Disp: , Rfl:    VUMERITY 231 MG CPDR, Take by mouth. Taking 2 tablets by mouth in the am and pm, Disp: , Rfl:   Allergies  Allergen Reactions   Doxycycline Rash    Lip swelling/burning sensation No sob     Metronidazole Itching   Review of Systems Objective:  There were no vitals filed for this visit.  General: Well developed, nourished, in no acute distress, alert and oriented x3   Dermatological: Skin is warm, dry and supple bilateral. Nails x 10 are well maintained; remaining integument appears unremarkable at this time. There are no open sores, no preulcerative lesions, no rash or signs of infection present.  Vascular: Dorsalis Pedis artery and Posterior Tibial artery pedal pulses are 2/4 bilateral with immedate capillary fill time. Pedal hair growth present. No varicosities and no lower extremity edema present bilateral.   Neruologic: Grossly intact via light touch bilateral. Vibratory intact via tuning fork bilateral. Protective threshold with Semmes Wienstein monofilament intact to all pedal sites bilateral. Patellar and Achilles deep tendon reflexes 2+ bilateral. No Babinski.  Clonus is noted with sharp dorsiflexion left side only.  Musculoskeletal: No gross boney pedal deformities bilateral. No pain, crepitus, or limitation noted with foot and ankle range of motion bilateral. Muscular strength 5/5 in all groups tested bilateral with the exception of plantarflexion on the left foot which demonstrates approximately 3.5-4.0/5.0.  Flexible hammertoe deformities noted bilateral with  exception of some arthritic change to the PIPJ fourth digit left foot.  Obvious smaller gastroc on her left side.  Gait: Unassisted, Nonantalgic.  With ambulation she does circumduct her ankle and leg left.  This seems to be her normal gait.     Radiographs:  Radiographs taken today demonstrate an osseously mature individual.  Hammertoe deformities are noted bilateral.  She does have osteopenia  noted bilateral appears to be a bit worse on the left side.  No significant osseous abnormalities otherwise noted.  Assessment & Plan:   Assessment: She has weakness in the left lower extremity left gastrosoleus complex.  Cannot rule out neuromuscular issues secondary to her MS.  Plan: At this point I think she should follow-up with a neurologist for an evaluation and most likely an EMG.  However I will leave that up to neurology we will follow-up with her once her test results have come back if neurology does not treat.     [place] Troia T. [place], [place]

## 2022-05-11 ENCOUNTER — Other Ambulatory Visit: Payer: Self-pay | Admitting: Podiatry

## 2022-05-11 DIAGNOSIS — R531 Weakness: Secondary | ICD-10-CM

## 2022-05-15 ENCOUNTER — Encounter: Payer: Self-pay | Admitting: Neurology

## 2022-05-15 ENCOUNTER — Other Ambulatory Visit: Payer: Self-pay

## 2022-05-15 DIAGNOSIS — R202 Paresthesia of skin: Secondary | ICD-10-CM

## 2022-06-15 ENCOUNTER — Encounter: Payer: BC Managed Care – PPO | Admitting: Neurology

## 2022-10-10 ENCOUNTER — Ambulatory Visit (INDEPENDENT_AMBULATORY_CARE_PROVIDER_SITE_OTHER): Payer: BC Managed Care – PPO | Admitting: Primary Care

## 2022-10-10 ENCOUNTER — Encounter: Payer: Self-pay | Admitting: Primary Care

## 2022-10-10 VITALS — BP 124/76 | HR 70 | Temp 98.2°F | Ht 63.0 in | Wt 126.0 lb

## 2022-10-10 DIAGNOSIS — E2839 Other primary ovarian failure: Secondary | ICD-10-CM

## 2022-10-10 DIAGNOSIS — Z0000 Encounter for general adult medical examination without abnormal findings: Secondary | ICD-10-CM | POA: Diagnosis not present

## 2022-10-10 DIAGNOSIS — Z1231 Encounter for screening mammogram for malignant neoplasm of breast: Secondary | ICD-10-CM

## 2022-10-10 DIAGNOSIS — G40909 Epilepsy, unspecified, not intractable, without status epilepticus: Secondary | ICD-10-CM

## 2022-10-10 DIAGNOSIS — G35 Multiple sclerosis: Secondary | ICD-10-CM | POA: Diagnosis not present

## 2022-10-10 DIAGNOSIS — Z23 Encounter for immunization: Secondary | ICD-10-CM | POA: Diagnosis not present

## 2022-10-10 DIAGNOSIS — E785 Hyperlipidemia, unspecified: Secondary | ICD-10-CM

## 2022-10-10 DIAGNOSIS — R21 Rash and other nonspecific skin eruption: Secondary | ICD-10-CM

## 2022-10-10 LAB — COMPREHENSIVE METABOLIC PANEL
ALT: 20 U/L (ref 0–35)
AST: 21 U/L (ref 0–37)
Albumin: 4.7 g/dL (ref 3.5–5.2)
Alkaline Phosphatase: 60 U/L (ref 39–117)
BUN: 11 mg/dL (ref 6–23)
CO2: 28 mEq/L (ref 19–32)
Calcium: 9.4 mg/dL (ref 8.4–10.5)
Chloride: 103 mEq/L (ref 96–112)
Creatinine, Ser: 0.56 mg/dL (ref 0.40–1.20)
GFR: 96.25 mL/min (ref >60.00)
Glucose, Bld: 95 mg/dL (ref 70–99)
Potassium: 3.8 mEq/L (ref 3.5–5.1)
Sodium: 139 mEq/L (ref 135–145)
Total Bilirubin: 0.8 mg/dL (ref 0.2–1.2)
Total Protein: 7.1 g/dL (ref 6.0–8.3)

## 2022-10-10 LAB — CBC
HCT: 42 % (ref 36.0–46.0)
Hemoglobin: 14.2 g/dL (ref 12.0–15.0)
MCHC: 33.9 g/dL (ref 30.0–36.0)
MCV: 93.8 fl (ref 78.0–100.0)
Platelets: 274 10*3/uL (ref 150.0–400.0)
RBC: 4.48 Mil/uL (ref 3.87–5.11)
RDW: 13.5 % (ref 11.5–15.5)
WBC: 3.4 10*3/uL — ABNORMAL LOW (ref 4.0–10.5)

## 2022-10-10 LAB — LIPID PANEL
Cholesterol: 218 mg/dL — ABNORMAL HIGH (ref 0–200)
HDL: 98.5 mg/dL (ref >39.00)
LDL Cholesterol: 111 mg/dL — ABNORMAL HIGH (ref 0–99)
NonHDL: 119.38
Total CHOL/HDL Ratio: 2
Triglycerides: 44 mg/dL (ref 0.0–149.0)
VLDL: 8.8 mg/dL (ref 0.0–40.0)

## 2022-10-10 NOTE — Progress Notes (Signed)
Established Patient Office Visit  Subjective   Patient ID: BEKI GREASON, female    DOB: [birth year]  Age: 67 y.o. MRN: [id number]  Chief Complaint  Patient presents with   Annual Exam    Fasting     HPI  Mkenna Saar is a 67 year old female with past medical history of multiple sclerosis, seizure disorder, lumbar radiculopathy, HLD, immunosuppression, chronic left hip pain presents today for complete physical and follow up of chronic conditions.  Immunizations: -Tetanus: Completed in 2012; due -Influenza: Declines. -Shingles: Declines.   Diet: Fair diet.  Exercise: No regular exercise.  Eye exam: Completes annually  Dental exam: completed several years ago.   Pap Smear: 2023; due in 2026.  Mammogram: May, 2023; due in May 2024.  Bone Density Scan: Due in July.   Colonoscopy: Completed in 2021. Due in November, 2024.   Patient Active Problem List   Diagnosis Date Noted   Immunosuppression ([place]) 09/20/2021   Swelling of hand joint, right 08/05/2021   Polyarthralgia 08/05/2021   Facial rash 05/24/2021   Hyperkalemia 06/04/2020   Abnormal gait 03/25/2020   Muscle contracture 01/08/2020   Muscle weakness 07/04/2019   Hyperlipidemia 05/06/2019   Elevated LFTs 05/06/2019   Lumbar radiculopathy 11/01/2018   Chronic left hip pain 05/01/2018   Preventative health care 05/01/2018   Vitamin D deficiency 04/05/2018   Multiple sclerosis ([place]) 02/21/2018   Seizure disorder ([place]) 02/21/2018   Past Medical History:  Diagnosis Date   Chickenpox    Multiple sclerosis ([place])    Rosacea    Seizure ([place])    Past Surgical History:  Procedure Laterality Date   COLONOSCOPY     COLONOSCOPY WITH PROPOFOL N/A 05/27/2020   Procedure: COLONOSCOPY WITH PROPOFOL;  Surgeon: Jonathon Bellows, MD;  Location: [facility] ENDOSCOPY;  Service: Gastroenterology;  Laterality: N/A;   GASTROCNEMIUS RECESSION Left 12/24/2019   Social History   Tobacco Use   Smoking status: Never   Smokeless  tobacco: Never  Vaping Use   Vaping Use: Never used  Substance Use Topics   Alcohol use: Yes    Alcohol/week: 4.0 standard drinks of alcohol    Types: 4 Glasses of wine per week    Comment: socially   Drug use: Never   Social History   Socioeconomic History   Marital status: Married    Spouse name: Not on file   Number of children: Not on file   Years of education: Not on file   Highest education level: Not on file  Occupational History   Not on file  Tobacco Use   Smoking status: Never   Smokeless tobacco: Never  Vaping Use   Vaping Use: Never used  Substance and Sexual Activity   Alcohol use: Yes    Alcohol/week: 4.0 standard drinks of alcohol    Types: 4 Glasses of wine per week    Comment: socially   Drug use: Never   Sexual activity: Yes    Partners: Male    Comment: Married  Other Topics Concern   Not on file  Social History Narrative   Not on file   Social Determinants of Health   Financial Resource Strain: Not on file  Food Insecurity: Not on file  Transportation Needs: Not on file  Physical Activity: Not on file  Stress: Not on file  Social Connections: Not on file  Intimate Partner Violence: Not on file   Allergies  Allergen Reactions   Doxycycline Rash    Lip swelling/burning sensation  No sob     Metronidazole Itching      Review of Systems  Constitutional:  Negative for chills and fever.  Eyes:  Negative for blurred vision.  Respiratory:  Negative for shortness of breath.   Cardiovascular:  Negative for chest pain.  Gastrointestinal:  Negative for abdominal pain, nausea and vomiting.  Genitourinary:  Negative for dysuria and urgency.  Musculoskeletal:  Negative for myalgias.  Neurological:  Negative for dizziness, tingling and headaches.  Psychiatric/Behavioral:  Negative for depression. The patient is not nervous/anxious.       Objective:     BP 124/76   Pulse 70   Temp 98.2 F (36.8 C) (Temporal)   Ht 5\' 3"  (1.6 m)   Wt 126  lb (57.2 kg)   SpO2 99%   BMI 22.32 kg/m  BP Readings from Last 3 Encounters:  10/10/22 124/76  04/05/22 138/81  10/06/21 122/74   Wt Readings from Last 3 Encounters:  10/10/22 126 lb (57.2 kg)  04/05/22 124 lb (56.2 kg)  10/06/21 126 lb (57.2 kg)      Physical Exam Vitals and nursing note reviewed.  Constitutional:      Appearance: Normal appearance.  HENT:     Right Ear: Tympanic membrane, ear canal and external ear normal.     Left Ear: Tympanic membrane, ear canal and external ear normal.  Eyes:     Extraocular Movements: Extraocular movements intact.     Pupils: Pupils are equal, round, and reactive to light.  Cardiovascular:     Rate and Rhythm: Normal rate and regular rhythm.     Pulses: Normal pulses.     Heart sounds: Normal heart sounds.  Pulmonary:     Effort: Pulmonary effort is normal.     Breath sounds: Normal breath sounds.  Abdominal:     General: Bowel sounds are normal.     Palpations: Abdomen is soft.  Musculoskeletal:        General: Normal range of motion.  Skin:    General: Skin is warm.  Neurological:     Mental Status: She is alert and oriented to person, place, and time.  Psychiatric:        Mood and Affect: Mood normal.        Behavior: Behavior normal.     No results found for any visits on 10/10/22.     The 10-year ASCVD risk score (Arnett DK, et al., 2019) is: 3.9%    Assessment & Plan:   Problem List Items Addressed This Visit       Nervous and Auditory   Multiple sclerosis [facility])    Following with neurology in [place].  Stable.   Continue 231 mg, 1 tab in AM and two tabs in PM.   Reviewed neurology notes from March, 2024.        Seizure disorder ([place])    No recent seizure activity.   Continue Keppra 500 mg BID.         Other   Preventative health care - Primary    Tetanus vaccine due, given today.  Shingles vaccine and influenza vaccine declines.   Mammogram due, ordered today.  Colonoscopy up to date,  due in November, 2024.  Pap Smear up to date.  Bone density due at the end of July. Ordered today.  Physical exam stable. Labs pending. Discussed the importance of regular exercise and monitoring diet.       Hyperlipidemia    Repeat lipid panel pending .  Discussed  the importance of continuing to monitor diet and regular exercise.       Relevant Orders   Lipid panel   Comprehensive metabolic panel   CBC   Other Visit Diagnoses     Encounter for screening mammogram for malignant neoplasm of breast       Relevant Orders   MM 3D SCREENING MAMMOGRAM BILATERAL BREAST   Estrogen deficiency       Relevant Orders   DG Bone Density       Return in about 1 year (around 10/10/2023) for physical.    Tinnie Gens, BSN-RN, DNP STUDENT

## 2022-10-10 NOTE — Patient Instructions (Addendum)
Stop by the lab prior to leaving today. I will notify you of your results once received.   Referral for mammogram and bone density has been placed. Please call and make the appointment. Bone density will be due at the end of July.   Schedule colonoscopy in November.   It was a pleasure to see you today!  An order has been placed for []   2D Mammogram  [x]   3D Mammogram  [x]   Bone Density   Please call and schedule your appointment at the following location:   [x]   [facility]  [place] [address]  [phone]  []   [facility])   [address] Room [place], [place] [address]  [phone]  []   [facility]      [address] [place], [place]         []   [facility]  [place], [place]  []  [facility]   [address] [place], [place] [address]  []  [facility]  [address] [place], [place] [address] [phone]    Make sure to wear two piece clothing  No lotions powders or deodorants the day of the appointment Make sure to bring picture ID and insurance card.  Bring list of medications you are currently taking including any supplements.   Schedule your screening mammogram through MyChart!   Select [place] imaging sites can now be scheduled through [place].  Log into your MyChart account.  Go to 'Visit' (or 'Appointments' if  on mobile App) --> Schedule an  Appointment  Under 'Select a Reason for Visit' choose the Mammogram  Screening option.  Complete the pre-visit questions  and select the time and place that  best fits your schedule

## 2022-10-10 NOTE — Assessment & Plan Note (Signed)
Following with dermatology, office notes reviewed from March 2024.  Start methotrexate 7.5 mg weekly. Labs pending today.

## 2022-10-10 NOTE — Assessment & Plan Note (Addendum)
Repeat lipid panel pending .  Discussed the importance of continuing to monitor diet and regular exercise.   I evaluated patient, was consulted regarding treatment, and agree with assessment and plan per Tinnie Gens, RN, DNP student.   Allie Bossier, NP-C

## 2022-10-10 NOTE — Progress Notes (Signed)
Subjective:    Patient ID: Natasha George, female    DOB: 02/02/1956, 67 y.o.   MRN: [id number]  HPI  Natasha George is a very pleasant 67 y.o. female who presents today for complete physical and follow up of chronic conditions.  Immunizations: -Tetanus: Completed in 2012, due today -Influenza: Due this Fall -Shingles: Never completed, declines   Diet: Fair diet.  Exercise: No regular exercise. Some walking   Eye exam: Completes annually  Dental exam: Completed several years ago.   Pap Smear: September 2023 Mammogram: May 2023  Colonoscopy: Completed in 2021, due November 2024  BP Readings from Last 3 Encounters:  10/10/22 124/76  04/05/22 138/81  10/06/21 122/74       Review of Systems  Constitutional:  Negative for unexpected weight change.  HENT:  Negative for rhinorrhea.   Respiratory:  Negative for cough and shortness of breath.   Cardiovascular:  Negative for chest pain.  Gastrointestinal:  Negative for constipation and diarrhea.  Genitourinary:  Negative for difficulty urinating.  Musculoskeletal:  Negative for arthralgias and myalgias.  Skin:  Positive for rash.  Allergic/Immunologic: Negative for environmental allergies.  Neurological:  Negative for dizziness and headaches.  Psychiatric/Behavioral:  The patient is not nervous/anxious.          Past Medical History:  Diagnosis Date   Chickenpox    Multiple sclerosis ([place])    Rosacea    Seizure ([place])     Social History   Socioeconomic History   Marital status: Married    Spouse name: Not on file   Number of children: Not on file   Years of education: Not on file   Highest education level: Not on file  Occupational History   Not on file  Tobacco Use   Smoking status: Never   Smokeless tobacco: Never  Vaping Use   Vaping Use: Never used  Substance and Sexual Activity   Alcohol use: Yes    Alcohol/week: 4.0 standard drinks of alcohol    Types: 4 Glasses of wine per week     Comment: socially   Drug use: Never   Sexual activity: Yes    Partners: Male    Comment: Married  Other Topics Concern   Not on file  Social History Narrative   Not on file   Social Determinants of Health   Financial Resource Strain: Not on file  Food Insecurity: Not on file  Transportation Needs: Not on file  Physical Activity: Not on file  Stress: Not on file  Social Connections: Not on file  Intimate Partner Violence: Not on file    Past Surgical History:  Procedure Laterality Date   COLONOSCOPY     COLONOSCOPY WITH PROPOFOL N/A 05/27/2020   Procedure: COLONOSCOPY WITH PROPOFOL;  Surgeon: Jonathon Bellows, MD;  Location: [facility] ENDOSCOPY;  Service: Gastroenterology;  Laterality: N/A;   GASTROCNEMIUS RECESSION Left 12/24/2019    Family History  Adopted: Yes  Problem Relation Age of Onset   Breast cancer Neg Hx     Allergies  Allergen Reactions   Doxycycline Rash    Lip swelling/burning sensation No sob     Metronidazole Itching    Current Outpatient Medications on File Prior to Visit  Medication Sig Dispense Refill   Cholecalciferol 125 MCG (5000 UT) TABS cholecalciferol (vitamin D3) 125 mcg (5,000 unit) tablet     Clocortolone Pivalate (CLODERM) 0.1 % cream Apply topically.     hydrocortisone 2.5 % ointment APPLY NIGHTLY TO EYELIDS  levETIRAcetam (KEPPRA) 500 MG tablet Taking 500 mg by mouth in the morning and 1000 mg in the evening     Multiple Vitamins-Minerals (MULTIVITAMIN WOMEN PO) Take by mouth.     VUMERITY 231 MG CPDR Take by mouth. Taking 1 tablets by mouth in the am and 2 tablets in the pm     No current facility-administered medications on file prior to visit.    BP 124/76   Pulse 70   Temp 98.2 F (36.8 C) (Temporal)   Ht 5\' 3"  (1.6 m)   Wt 126 lb (57.2 kg)   SpO2 99%   BMI 22.32 kg/m  Objective:   Physical Exam HENT:     Right Ear: Tympanic membrane and ear canal normal.     Left Ear: Tympanic membrane and ear canal normal.     Nose:  Nose normal.  Eyes:     Conjunctiva/sclera: Conjunctivae normal.     Pupils: Pupils are equal, round, and reactive to light.  Neck:     Thyroid: No thyromegaly.  Cardiovascular:     Rate and Rhythm: Normal rate and regular rhythm.     Heart sounds: No murmur heard. Pulmonary:     Effort: Pulmonary effort is normal.     Breath sounds: Normal breath sounds. No rales.  Abdominal:     General: Bowel sounds are normal.     Palpations: Abdomen is soft.     Tenderness: There is no abdominal tenderness.  Musculoskeletal:        General: Normal range of motion.     Cervical back: Neck supple.  Lymphadenopathy:     Cervical: No cervical adenopathy.  Skin:    General: Skin is warm and dry.     Findings: No rash.  Neurological:     Mental Status: She is alert and oriented to person, place, and time.     Cranial Nerves: No cranial nerve deficit.     Deep Tendon Reflexes: Reflexes are normal and symmetric.  Psychiatric:        Mood and Affect: Mood normal.           Assessment & Plan:  Preventative health care Assessment & Plan: Tetanus vaccine due, given today.  Shingles vaccine and influenza vaccine declines.   Mammogram due, ordered today.  Colonoscopy up to date, due in November, 2024.  Pap Smear up to date.  Bone density due at the end of July. Ordered today.  Physical exam stable. Labs pending. Discussed the importance of regular exercise and monitoring diet. 4  I evaluated patient, was consulted regarding treatment, and agree with assessment and plan per Tinnie Gens, RN, DNP student.   Allie Bossier, NP-C    Multiple sclerosis [facility] Assessment & Plan: Following with neurology in [place].  Stable.   Continue Vumerity 231 mg, 1 tab in AM and two tabs in PM.   Reviewed neurology notes from March, 2024.   I evaluated patient, was consulted regarding treatment, and agree with assessment and plan per Tinnie Gens, RN, DNP student.   Allie Bossier, NP-C    Seizure  disorder [facility]) Assessment & Plan: No recent seizure activity.   Continue Keppra 500 mg BID.   I evaluated patient, was consulted regarding treatment, and agree with assessment and plan per Tinnie Gens, RN, DNP student.   Allie Bossier, NP-C    Hyperlipidemia, unspecified hyperlipidemia type Assessment & Plan: Repeat lipid panel pending .  Discussed the importance of continuing to monitor diet and regular exercise.  I evaluated patient, was consulted regarding treatment, and agree with assessment and plan per Tinnie Gens, RN, DNP student.   Allie Bossier, NP-C   Orders: -     Lipid panel -     Comprehensive metabolic panel -     CBC  Encounter for screening mammogram for malignant neoplasm of breast -     3D Screening Mammogram, Left and Right; Future  Estrogen deficiency -     DG Bone Density; Future  Facial rash Assessment & Plan: Following with dermatology, office notes reviewed from March 2024.  Start methotrexate 7.5 mg weekly. Labs pending today.         Pleas Koch, NP

## 2022-10-10 NOTE — Assessment & Plan Note (Addendum)
Vitamin D level pending. 

## 2022-10-10 NOTE — Assessment & Plan Note (Addendum)
No recent seizure activity.   Continue Keppra 500 mg BID.   I evaluated patient, was consulted regarding treatment, and agree with assessment and plan per Tinnie Gens, RN, DNP student.   Allie Bossier, NP-C

## 2022-10-10 NOTE — Assessment & Plan Note (Addendum)
Following with neurology in [place].  Stable.   Continue Vumerity 231 mg, 1 tab in AM and two tabs in PM.   Reviewed neurology notes from March, 2024.   I evaluated patient, was consulted regarding treatment, and agree with assessment and plan per Tinnie Gens, RN, DNP student.   Allie Bossier, NP-C

## 2022-10-10 NOTE — Assessment & Plan Note (Addendum)
Tetanus vaccine due, given today.  Shingles vaccine and influenza vaccine declines.   Mammogram due, ordered today.  Colonoscopy up to date, due in November, 2024.  Pap Smear up to date.  Bone density due at the end of July. Ordered today.  Physical exam stable. Labs pending. Discussed the importance of regular exercise and monitoring diet. 4  I evaluated patient, was consulted regarding treatment, and agree with assessment and plan per Tinnie Gens, RN, DNP student.   Allie Bossier, NP-C

## 2022-11-17 ENCOUNTER — Ambulatory Visit
Admission: RE | Admit: 2022-11-17 | Discharge: 2022-11-17 | Disposition: A | Discharge status: Home / Self Care | Payer: BC Managed Care – PPO | Source: Ambulatory Visit | Attending: Primary Care | Admitting: Primary Care

## 2022-11-17 DIAGNOSIS — Z1231 Encounter for screening mammogram for malignant neoplasm of breast: Secondary | ICD-10-CM | POA: Diagnosis present

## 2023-03-21 ENCOUNTER — Telehealth: Payer: Self-pay

## 2023-03-21 ENCOUNTER — Other Ambulatory Visit: Payer: Self-pay

## 2023-03-21 DIAGNOSIS — Z1211 Encounter for screening for malignant neoplasm of colon: Secondary | ICD-10-CM

## 2023-03-21 DIAGNOSIS — Z86010 Personal history of colonic polyps: Secondary | ICD-10-CM

## 2023-03-21 MED ORDER — GOLYTELY 236 G PO SOLR: 4000.0000 mL | Freq: Once | ORAL | 0 refills | Status: AC

## 2023-03-21 NOTE — Telephone Encounter (Signed)
Pt left message requesting call back to schedule colonoscopy

## 2023-03-21 NOTE — Telephone Encounter (Signed)
Gastroenterology Pre-Procedure Review  Request Date: 06/04/23 Requesting Physician: Dr. Tobi Bastos  PATIENT REVIEW QUESTIONS: The patient responded to the following health history questions as indicated:    1. Are you having any GI issues? no 2. Do you have a personal history of Polyps? Yes last colonoscopy was with Dr. Tobi Bastos 05/27/2020 recommended repeat in 3 years 3. Do you have a family history of Colon Cancer or Polyps? no 4. Diabetes Mellitus? no 5. Joint replacements in the past 12 months?no 6. Major health problems in the past 3 months?no 7. Any artificial heart valves, MVP, or defibrillator?no    MEDICATIONS & ALLERGIES:    Patient reports the following regarding taking any anticoagulation/antiplatelet therapy:   Plavix, Coumadin, Eliquis, Xarelto, Lovenox, Pradaxa, Brilinta, or Effient? no Aspirin? no  Patient confirms/reports the following medications:  Current Outpatient Medications  Medication Sig Dispense Refill   Cholecalciferol 125 MCG (5000 UT) TABS cholecalciferol (vitamin D3) 125 mcg (5,000 unit) tablet     Clocortolone Pivalate (CLODERM) 0.1 % cream Apply topically.     hydrocortisone 2.5 % ointment APPLY NIGHTLY TO EYELIDS     levETIRAcetam (KEPPRA) 500 MG tablet Taking 500 mg by mouth in the morning and 1000 mg in the evening     Multiple Vitamins-Minerals (MULTIVITAMIN WOMEN PO) Take by mouth.     VUMERITY 231 MG CPDR Take by mouth. Taking 1 tablets by mouth in the am and 2 tablets in the pm     No current facility-administered medications for this visit.    Patient confirms/reports the following allergies:  Allergies  Allergen Reactions   Doxycycline Rash    Lip swelling/burning sensation No sob     Metronidazole Itching    No orders of the defined types were placed in this encounter.   AUTHORIZATION INFORMATION Primary Insurance: 1D#: Group #:  Secondary Insurance: 1D#: Group #:  SCHEDULE INFORMATION: Date: 06/04/23 Time: Location: [facility]

## 2023-04-13 ENCOUNTER — Ambulatory Visit (INDEPENDENT_AMBULATORY_CARE_PROVIDER_SITE_OTHER): Payer: Medicare Other | Admitting: Family Medicine

## 2023-04-13 VITALS — BP 120/72 | HR 73 | Temp 97.9°F | Ht 63.0 in | Wt 129.6 lb

## 2023-04-13 DIAGNOSIS — R269 Unspecified abnormalities of gait and mobility: Secondary | ICD-10-CM

## 2023-04-13 DIAGNOSIS — M25562 Pain in left knee: Secondary | ICD-10-CM

## 2023-04-13 DIAGNOSIS — R2242 Localized swelling, mass and lump, left lower limb: Secondary | ICD-10-CM | POA: Diagnosis not present

## 2023-04-13 HISTORY — DX: Pain in left knee: M25.562

## 2023-04-13 HISTORY — DX: Localized swelling, mass and lump, left lower limb: R22.42

## 2023-04-13 MED ORDER — DICLOFENAC SODIUM 75 MG PO TBEC: 75.0000 mg | DELAYED_RELEASE_TABLET | Freq: Two times a day (BID) | ORAL | 0 refills | Status: DC

## 2023-04-13 MED ORDER — TRAMADOL HCL 50 MG PO TABS: 50.0000 mg | ORAL_TABLET | Freq: Three times a day (TID) | ORAL | 0 refills | Status: AC | PRN

## 2023-04-13 NOTE — Assessment & Plan Note (Signed)
She does have significant swelling in left lower leg but no calf pain suggesting DVT less likely.  Also no chest pain or shortness of breath.  Recommended elevating leg as swelling in left lower leg most likely secondary to knee injury and venous insufficiency resulting in edema.  Return and ER precautions provided.

## 2023-04-13 NOTE — Assessment & Plan Note (Signed)
Acute left knee pain most likely secondary to osteoarthritis flare versus medial collateral ligament injury versus meniscal injury. No known injury so flare of osteoarthritis with walking on uneven surface in [place] most likely.  Recommend ice and elevation.  Stop ibuprofen.  Changed to diclofenac 75 mg p.o. twice daily and can use tramadol 25 to 50 mg 3 times daily as needed for breakthrough pain, primarily in 10 this medication for use at bedtime to help her sleep. She will return on Monday for x-ray given we do not have x-ray ability in the office at this time.  Return and ER precautions provided.

## 2023-04-13 NOTE — Progress Notes (Signed)
Patient ID: Natasha George, female    DOB: 09/10/1955, 67 y.o.   MRN: [id number]  This visit was conducted in person.  BP 120/72 (BP Location: Left Arm, Patient Position: Sitting, Cuff Size: Normal)   Pulse 73   Temp 97.9 F (36.6 C) (Oral)   Ht 5\' 3"  (1.6 m)   Wt 129 lb 9.6 oz (58.8 kg)   SpO2 99%   BMI 22.96 kg/m    CC:  Chief Complaint  Patient presents with   Knee Pain    Pt c/o L knee pain 1 wks ago. Swelling a day go. Warm to touch, dull ache. Went to [place] trip and did a lot of walk. Got back on Wednesday night. Cut trip short due to knee pain. Taking motrin.     Subjective:   HPI: Natasha George is a 67 y.o. female presenting on 04/13/2023 for Knee Pain (Pt c/o L knee pain 1 wks ago. Swelling a day go. Warm to touch, dull ache. Went to [place] trip and did a lot of walk. Got back on Wednesday night. Cut trip short due to knee pain. Taking motrin. )   Had abnormal gait usually given pat ankle issue.. was doing  PT prior to trip.   Pain started suddenly in left knee after 3 hour drive and 1.5 hr train trip.  Pain is medial. No fall.. has been overall more active  on trip.  Knee stiff after sitting. Pain in knee with bending and flexing.  No calf tenderness... but left foot feeling tight.  Waking her up at night.  She has been using Motrin 800 mg every 4 hours.    No chest pain, no SOB.   No knee history.  No history of DVT.  Relevant past medical, surgical, family and social history reviewed and updated as indicated. Interim medical history since our last visit reviewed. Allergies and medications reviewed and updated. Outpatient Medications Prior to Visit  Medication Sig Dispense Refill   Cholecalciferol 125 MCG (5000 UT) TABS cholecalciferol (vitamin D3) 125 mcg (5,000 unit) tablet     Clocortolone Pivalate (CLODERM) 0.1 % cream Apply topically.     hydrocortisone 2.5 % ointment APPLY NIGHTLY TO EYELIDS     levETIRAcetam (KEPPRA) 500 MG tablet Taking  500 mg by mouth in the morning and 1000 mg in the evening     Multiple Vitamins-Minerals (MULTIVITAMIN WOMEN PO) Take by mouth.     VUMERITY 231 MG CPDR Take by mouth. Taking 2 tablets by mouth in the am and 2 tablets in the pm     No facility-administered medications prior to visit.     Per HPI unless specifically indicated in ROS section below Review of Systems  Constitutional:  Negative for fatigue and fever.  HENT:  Negative for congestion.   Eyes:  Negative for pain.  Respiratory:  Negative for cough and shortness of breath.   Cardiovascular:  Negative for chest pain, palpitations and leg swelling.  Gastrointestinal:  Negative for abdominal pain.  Genitourinary:  Negative for dysuria and vaginal bleeding.  Musculoskeletal:  Positive for arthralgias. Negative for back pain.  Neurological:  Negative for syncope, light-headedness and headaches.  Psychiatric/Behavioral:  Negative for dysphoric mood.    Objective:  BP 120/72 (BP Location: Left Arm, Patient Position: Sitting, Cuff Size: Normal)   Pulse 73   Temp 97.9 F (36.6 C) (Oral)   Ht 5\' 3"  (1.6 m)   Wt 129 lb 9.6 oz (58.8 kg)  SpO2 99%   BMI 22.96 kg/m   Wt Readings from Last 3 Encounters:  04/13/23 129 lb 9.6 oz (58.8 kg)  10/10/22 126 lb (57.2 kg)  04/05/22 124 lb (56.2 kg)      Physical Exam Constitutional:      General: She is not in acute distress.    Appearance: Normal appearance. She is well-developed. She is not ill-appearing or toxic-appearing.  HENT:     Head: Normocephalic.     Right Ear: Hearing, tympanic membrane, ear canal and external ear normal. Tympanic membrane is not erythematous, retracted or bulging.     Left Ear: Hearing, tympanic membrane, ear canal and external ear normal. Tympanic membrane is not erythematous, retracted or bulging.     Nose: No mucosal edema or rhinorrhea.     Right Sinus: No maxillary sinus tenderness or frontal sinus tenderness.     Left Sinus: No maxillary sinus  tenderness or frontal sinus tenderness.     Mouth/Throat:     Mouth: Oropharynx is clear and moist and mucous membranes are normal.     Pharynx: Uvula midline.  Eyes:     General: Lids are normal. Lids are everted, no foreign bodies appreciated.     Extraocular Movements: EOM normal.     Conjunctiva/sclera: Conjunctivae normal.     Pupils: Pupils are equal, round, and reactive to light.  Neck:     Thyroid: No thyroid mass or thyromegaly.     Vascular: No carotid bruit.     Trachea: Trachea normal.  Cardiovascular:     Rate and Rhythm: Normal rate and regular rhythm.     Pulses: Normal pulses.     Heart sounds: Normal heart sounds, S1 normal and S2 normal. No murmur heard.    No friction rub. No gallop.  Pulmonary:     Effort: Pulmonary effort is normal. No tachypnea or respiratory distress.     Breath sounds: Normal breath sounds. No decreased breath sounds, wheezing, rhonchi or rales.  Abdominal:     General: Bowel sounds are normal.     Palpations: Abdomen is soft.     Tenderness: There is no abdominal tenderness.  Musculoskeletal:     Cervical back: Normal range of motion and neck supple.     Comments: Left knee with mild swelling and increased warmth no redness, tenderness to palpation at medial joint line and across medial collateral ligament. Diffuse swelling in left lower leg and ankle, negative Homans test and no calf tenderness. Positive medial McMurray's test, no evidence of ACL or PCL injury.  Skin:    General: Skin is warm, dry and intact.     Findings: No rash.  Neurological:     Mental Status: She is alert.  Psychiatric:        Mood and Affect: Mood is not anxious or depressed.        Speech: Speech normal.        Behavior: Behavior normal. Behavior is cooperative.        Thought Content: Thought content normal.        Cognition and Memory: Cognition and memory normal.        Judgment: Judgment normal.       Results for orders placed or performed in visit on  10/10/22  Lipid panel  Result Value Ref Range   Cholesterol 218 (H) 0 - 200 mg/dL   Triglycerides 82.9 0.0 - 149.0 mg/dL   HDL 56.21 >30.86 mg/dL   VLDL 8.8 0.0 - 40.0  mg/dL   LDL Cholesterol 696 (H) 0 - 99 mg/dL   Total CHOL/HDL Ratio 2    NonHDL 119.38   Comprehensive metabolic panel  Result Value Ref Range   Sodium 139 135 - 145 mEq/L   Potassium 3.8 3.5 - 5.1 mEq/L   Chloride 103 96 - 112 mEq/L   CO2 28 19 - 32 mEq/L   Glucose, Bld 95 70 - 99 mg/dL   BUN 11 6 - 23 mg/dL   Creatinine, Ser 2.95 0.40 - 1.20 mg/dL   Total Bilirubin 0.8 0.2 - 1.2 mg/dL   Alkaline Phosphatase 60 39 - 117 U/L   AST 21 0 - 37 U/L   ALT 20 0 - 35 U/L   Total Protein 7.1 6.0 - 8.3 g/dL   Albumin 4.7 3.5 - 5.2 g/dL   GFR 28.41 >32.44 mL/min   Calcium 9.4 8.4 - 10.5 mg/dL  CBC  Result Value Ref Range   WBC 3.4 (L) 4.0 - 10.5 K/uL   RBC 4.48 3.87 - 5.11 Mil/uL   Platelets 274.0 150.0 - 400.0 K/uL   Hemoglobin 14.2 12.0 - 15.0 g/dL   HCT 01.0 27.2 - 53.6 %   MCV 93.8 78.0 - 100.0 fl   MCHC 33.9 30.0 - 36.0 g/dL   RDW 64.4 03.4 - 74.2 %    Assessment and Plan  Abnormal gait -     DG Knee Complete 4 Views Left; Future  Acute pain of left knee Assessment & Plan: Acute left knee pain most likely secondary to osteoarthritis flare versus medial collateral ligament injury versus meniscal injury. No known injury so flare of osteoarthritis with walking on uneven surface in [place] most likely.  Recommend ice and elevation.  Stop ibuprofen.  Changed to diclofenac 75 mg p.o. twice daily and can use tramadol 25 to 50 mg 3 times daily as needed for breakthrough pain, primarily in 10 this medication for use at bedtime to help her sleep. She will return on Monday for x-ray given we do not have x-ray ability in the office at this time.  Return and ER precautions provided.  Orders: -     DG Knee Complete 4 Views Left; Future  Localized swelling of left lower leg Assessment & Plan: She does have  significant swelling in left lower leg but no calf pain suggesting DVT less likely.  Also no chest pain or shortness of breath.  Recommended elevating leg as swelling in left lower leg most likely secondary to knee injury and venous insufficiency resulting in edema.  Return and ER precautions provided.   Other orders -     Diclofenac Sodium; Take 1 tablet (75 mg total) by mouth 2 (two) times daily.  Dispense: 30 tablet; Refill: 0 -     traMADol HCl; Take 1 tablet (50 mg total) by mouth every 8 (eight) hours as needed for up to 5 days for severe pain or moderate pain.  Dispense: 15 tablet; Refill: 0    Return in about 3 days (around 04/16/2023) for  appt for X-ray.   Kerby Nora, MD

## 2023-04-16 ENCOUNTER — Ambulatory Visit (INDEPENDENT_AMBULATORY_CARE_PROVIDER_SITE_OTHER)
Admission: RE | Admit: 2023-04-16 | Discharge: 2023-04-16 | Disposition: A | Discharge status: Home / Self Care | Payer: Medicare Other | Source: Ambulatory Visit | Attending: Family Medicine | Admitting: Family Medicine

## 2023-04-16 DIAGNOSIS — R269 Unspecified abnormalities of gait and mobility: Secondary | ICD-10-CM

## 2023-04-16 DIAGNOSIS — M25562 Pain in left knee: Secondary | ICD-10-CM

## 2023-04-17 ENCOUNTER — Telehealth: Payer: Self-pay | Admitting: Primary Care

## 2023-04-17 NOTE — Telephone Encounter (Signed)
Pt called to let Dr. Ermalene Searing know her knee has swelled up more. Pt states her son, whose a nurse, took a look & suggested the pt visit a ER or UC. Pt states she visited a [facility] ER & was told there was a 10hr wait, then she visited [facility], where there was a wait also. Pt states she wasn't seen at all. Pt states her son was concerned there was a possible blood clot. Advised pt Dr. Ermalene Searing only had virtuals available for today, 10/1 & there was other slots available. Please advise. Call back # [phone]

## 2023-04-17 NOTE — Telephone Encounter (Signed)
Can we add her on for tomorrow (10/02) at 3 pm?

## 2023-04-18 ENCOUNTER — Other Ambulatory Visit: Payer: Self-pay | Admitting: Primary Care

## 2023-04-18 ENCOUNTER — Ambulatory Visit (INDEPENDENT_AMBULATORY_CARE_PROVIDER_SITE_OTHER): Payer: Medicare Other | Admitting: Primary Care

## 2023-04-18 ENCOUNTER — Encounter: Payer: Self-pay | Admitting: Primary Care

## 2023-04-18 ENCOUNTER — Ambulatory Visit
Admission: RE | Admit: 2023-04-18 | Discharge: 2023-04-18 | Disposition: A | Discharge status: Home / Self Care | Payer: Medicare Other | Source: Ambulatory Visit | Attending: Primary Care | Admitting: Primary Care

## 2023-04-18 VITALS — BP 128/78 | HR 75 | Temp 97.7°F | Ht 63.0 in | Wt 127.0 lb

## 2023-04-18 DIAGNOSIS — M79605 Pain in left leg: Secondary | ICD-10-CM

## 2023-04-18 DIAGNOSIS — M25562 Pain in left knee: Secondary | ICD-10-CM

## 2023-04-18 MED ORDER — KETOROLAC TROMETHAMINE 60 MG/2ML IM SOLN
60.0000 mg | Freq: Once | INTRAMUSCULAR | Status: AC
Start: 2023-04-18 — End: 2023-04-18
  Administered 2023-04-18: 60 mg via INTRAMUSCULAR

## 2023-04-18 MED ORDER — PREDNISONE 20 MG PO TABS
ORAL_TABLET | ORAL | 0 refills | Status: DC
Start: 2023-04-18 — End: 2023-06-04

## 2023-04-18 NOTE — Addendum Note (Signed)
Addended by: Lonia Blood on: 04/18/2023 04:31 PM   Modules accepted: Orders

## 2023-04-18 NOTE — Patient Instructions (Signed)
Stop by the lab prior to leaving today. I will notify you of your results once received.   Start prednisone tablets. Take two tablets my mouth once daily in the morning for four days, then one tablet once daily in the morning for four days.   You will receive a phone call today regarding your ultrasound.  It was a pleasure to see you today!

## 2023-04-18 NOTE — Progress Notes (Signed)
Subjective:    Patient ID: Natasha George, female    DOB: 1955/12/19, 67 y.o.   MRN: [id number]  HPI  Natasha George is a very pleasant 67 y.o. female with a history of multiple sclerosis, seizure disorder, muscle contracture, polyarthralgia, immunosuppression who presents today due to knee pain.  Her husband joins [place] today.  Evaluated by Dr. Ermalene Searing on 04/13/2023 for a 1 week history of left knee pain.  Several days prior to her visit she noticed swelling and warmth.  Recently returned from a trip to [place], walked a lot more than usual.  Unfortunately, she had to cut her trip short due to her knee pain.  During this visit she was prescribed diclofenac 75 mg tablets to take twice daily and tramadol 50 mg tablets to use every 8 hours as needed.  X-ray was completed but results have not yet returned.  Today she continues to experience left medial knee pain which is sometimes to the popliteal fossa. She describes her pain as "burning" with tingling on occasion to her feet. She's using a hiking/walking stick. She does mention several long flights to and from [place]. During her flight from [place] to [place] she was unable to get up as she was stuck in between two people. Her pain is worse with sitting, standing, and laying in bed for prolonged periods of time.    Review of Systems  Constitutional:  Negative for fever.  Musculoskeletal:  Positive for arthralgias and joint swelling.  Neurological:  Positive for numbness.         Past Medical History:  Diagnosis Date   Chickenpox    Multiple sclerosis (HCC)    Rosacea    Seizure (HCC)     Social History   Socioeconomic History   Marital status: Married    Spouse name: Not on file   Number of children: Not on file   Years of education: Not on file   Highest education level: Not on file  Occupational History   Not on file  Tobacco Use   Smoking status: Never   Smokeless tobacco: Never  Vaping Use   Vaping status: Never Used   Substance and Sexual Activity   Alcohol use: Yes    Alcohol/week: 4.0 standard drinks of alcohol    Types: 4 Glasses of wine per week    Comment: socially   Drug use: Never   Sexual activity: Yes    Partners: Male    Comment: Married  Other Topics Concern   Not on file  Social History Narrative   Not on file   Social Determinants of Health   Financial Resource Strain: Not on file  Food Insecurity: Not on file  Transportation Needs: Not on file  Physical Activity: Not on file  Stress: Not on file  Social Connections: Not on file  Intimate Partner Violence: Not on file    Past Surgical History:  Procedure Laterality Date   COLONOSCOPY     COLONOSCOPY WITH PROPOFOL N/A 05/27/2020   Procedure: COLONOSCOPY WITH PROPOFOL;  Surgeon: Wyline Mood, MD;  Location: [facility] ENDOSCOPY;  Service: Gastroenterology;  Laterality: N/A;   GASTROCNEMIUS RECESSION Left 12/24/2019    Family History  Adopted: Yes  Problem Relation Age of Onset   Breast cancer Neg Hx     Allergies  Allergen Reactions   Doxycycline Rash    Lip swelling/burning sensation No sob     Metronidazole Itching    Current Outpatient Medications on File Prior to Visit  Medication Sig Dispense Refill   Cholecalciferol 125 MCG (5000 UT) TABS cholecalciferol (vitamin D3) 125 mcg (5,000 unit) tablet     Clocortolone Pivalate (CLODERM) 0.1 % cream Apply topically.     diclofenac (VOLTAREN) 75 MG EC tablet Take 1 tablet (75 mg total) by mouth 2 (two) times daily. 30 tablet 0   hydrocortisone 2.5 % ointment APPLY NIGHTLY TO EYELIDS     levETIRAcetam (KEPPRA) 500 MG tablet Taking 500 mg by mouth in the morning and 1000 mg in the evening     Multiple Vitamins-Minerals (MULTIVITAMIN WOMEN PO) Take by mouth.     VUMERITY 231 MG CPDR Take by mouth. Taking 2 tablets by mouth in the am and 2 tablets in the pm     traMADol (ULTRAM) 50 MG tablet Take 1 tablet (50 mg total) by mouth every 8 (eight) hours as needed for up to 5  days for severe pain or moderate pain. (Patient not taking: Reported on 04/18/2023) 15 tablet 0   No current facility-administered medications on file prior to visit.    BP 128/78   Pulse 75   Temp 97.7 F (36.5 C) (Temporal)   Ht 5\' 3"  (1.6 m)   Wt 127 lb (57.6 kg)   SpO2 99%   BMI 22.50 kg/m  Objective:   Physical Exam Cardiovascular:     Rate and Rhythm: Normal rate.  Pulmonary:     Effort: Pulmonary effort is normal.  Musculoskeletal:     Left knee: Swelling present. Decreased range of motion. Tenderness present over the medial joint line. No ACL laxity.    Left lower leg: Swelling present.       Legs:     Comments: Mild swelling to the left lower extremity at anterior shin with tenderness.  Skin:    General: Skin is warm and dry.     Findings: No erythema.  Neurological:     Mental Status: She is alert.           Assessment & Plan:  Acute pain of left knee Assessment & Plan: Likely tendinitis versus arthritis flare based on HPI and presentation.  Checking labs today including CBC with differential and uric acid level. Waiting on plain film results.  Stat venous ultrasound ordered and pending given lower extremity pain with swelling, especially given recent long travel abroad.  Start prednisone tablets. Take two tablets my mouth once daily in the morning for four days, then one tablet once daily in the morning for four days.  Stop diclofenac.   60 mg of Toradol provided intramuscularly today.  Discussed other conservative care including knee sleeve for support.  Continue icing and elevation.  Await results.   Orders: -     Uric acid -     CBC with Differential/Platelet -     predniSONE; Take two tablets my mouth once daily in the morning for four days, then one tablet once daily in the morning for four days.  Dispense: 12 tablet; Refill: 0  Pain of left lower extremity -     US Venous Img Upper Uni Left (DVT); Future        Doreene Nest,  NP

## 2023-04-18 NOTE — Assessment & Plan Note (Addendum)
Likely tendinitis versus arthritis flare based on HPI and presentation.  Checking labs today including CBC with differential and uric acid level. Waiting on plain film results.  Stat venous ultrasound ordered and pending given lower extremity pain with swelling, especially given recent long travel abroad.  Start prednisone tablets. Take two tablets my mouth once daily in the morning for four days, then one tablet once daily in the morning for four days.  Stop diclofenac.   60 mg of Toradol provided intramuscularly today.  Discussed other conservative care including knee sleeve for support.  Continue icing and elevation.  Await results.

## 2023-04-19 DIAGNOSIS — M25562 Pain in left knee: Secondary | ICD-10-CM

## 2023-04-19 DIAGNOSIS — M25462 Effusion, left knee: Secondary | ICD-10-CM

## 2023-04-19 LAB — CBC WITH DIFFERENTIAL/PLATELET
Basophils Absolute: 0 10*3/uL (ref 0.0–0.1)
Basophils Relative: 0.6 % (ref 0.0–3.0)
Eosinophils Absolute: 0.1 10*3/uL (ref 0.0–0.7)
Eosinophils Relative: 2.1 % (ref 0.0–5.0)
HCT: 41.1 % (ref 36.0–46.0)
Hemoglobin: 13.5 g/dL (ref 12.0–15.0)
Lymphocytes Relative: 24.7 % (ref 12.0–46.0)
Lymphs Abs: 1.4 10*3/uL (ref 0.7–4.0)
MCHC: 32.9 g/dL (ref 30.0–36.0)
MCV: 94.9 fL (ref 78.0–100.0)
Monocytes Absolute: 0.4 10*3/uL (ref 0.1–1.0)
Monocytes Relative: 6.8 % (ref 3.0–12.0)
Neutro Abs: 3.7 10*3/uL (ref 1.4–7.7)
Neutrophils Relative %: 65.8 % (ref 43.0–77.0)
Platelets: 361 10*3/uL (ref 150.0–400.0)
RBC: 4.33 Mil/uL (ref 3.87–5.11)
RDW: 13.2 % (ref 11.5–15.5)
WBC: 5.6 10*3/uL (ref 4.0–10.5)

## 2023-04-19 LAB — URIC ACID: Uric Acid, Serum: 3.7 mg/dL (ref 2.4–7.0)

## 2023-04-27 ENCOUNTER — Encounter: Payer: Self-pay | Admitting: *Deleted

## 2023-04-30 NOTE — Telephone Encounter (Signed)
FYI Natasha George, I spoke with pt regarding this. Advised that this was authorized

## 2023-05-04 ENCOUNTER — Ambulatory Visit
Admission: RE | Admit: 2023-05-04 | Discharge: 2023-05-04 | Disposition: A | Discharge status: Home / Self Care | Payer: Medicare Other | Source: Ambulatory Visit | Attending: Primary Care | Admitting: Primary Care

## 2023-05-04 DIAGNOSIS — M25562 Pain in left knee: Secondary | ICD-10-CM | POA: Diagnosis present

## 2023-05-04 DIAGNOSIS — M25462 Effusion, left knee: Secondary | ICD-10-CM | POA: Insufficient documentation

## 2023-05-10 ENCOUNTER — Telehealth: Payer: Self-pay | Admitting: Primary Care

## 2023-05-10 NOTE — Telephone Encounter (Signed)
Noted  

## 2023-05-10 NOTE — Telephone Encounter (Signed)
Advised patient that results are not back yet and that radiology is behind with results. Advised patient we will let her know as soon as they are back.

## 2023-05-10 NOTE — Telephone Encounter (Signed)
Pt called in and stated that she would like to get the results of her MRI that was done on Friday  for her left knee and would like a call back pt can be reached at  [phone].

## 2023-05-17 ENCOUNTER — Other Ambulatory Visit: Payer: Self-pay | Admitting: Primary Care

## 2023-05-17 DIAGNOSIS — S82132A Displaced fracture of medial condyle of left tibia, initial encounter for closed fracture: Secondary | ICD-10-CM

## 2023-05-18 ENCOUNTER — Telehealth: Payer: Self-pay | Admitting: Primary Care

## 2023-05-18 NOTE — Telephone Encounter (Signed)
Pt called stating she had a referral sent by Chestine Spore to [facility] & she has an appt with Dr. Odis Luster today, 11/1 @ 2pm. Pt asked when the referral was sent over, was her MRI, xray & sonogram results sent over also? Pt states she wanted those results looked over by Dr. Odis Luster during their appt. Call back # [phone]

## 2023-05-18 NOTE — Telephone Encounter (Addendum)
Called and spoke with patient, advised her referral was sent yesterday and that imaging should have been sent, but I resent imaging in the event it wasn't sent over.

## 2023-06-01 ENCOUNTER — Other Ambulatory Visit: Payer: Self-pay

## 2023-06-01 DIAGNOSIS — Z860100 Personal history of colon polyps, unspecified: Secondary | ICD-10-CM

## 2023-06-04 ENCOUNTER — Ambulatory Visit: Payer: Medicare Other | Admitting: Certified Registered Nurse Anesthetist

## 2023-06-04 ENCOUNTER — Ambulatory Visit
Admission: RE | Admit: 2023-06-04 | Discharge: 2023-06-04 | Disposition: A | Discharge status: Home / Self Care | Payer: Medicare Other | Attending: Gastroenterology | Admitting: Gastroenterology

## 2023-06-04 ENCOUNTER — Encounter
Admission: RE | Disposition: A | Discharge status: Home / Self Care | Payer: Self-pay | Source: Home / Self Care | Attending: Gastroenterology

## 2023-06-04 ENCOUNTER — Encounter: Payer: Self-pay | Admitting: Gastroenterology

## 2023-06-04 DIAGNOSIS — K644 Residual hemorrhoidal skin tags: Secondary | ICD-10-CM | POA: Diagnosis not present

## 2023-06-04 DIAGNOSIS — Z09 Encounter for follow-up examination after completed treatment for conditions other than malignant neoplasm: Secondary | ICD-10-CM | POA: Insufficient documentation

## 2023-06-04 DIAGNOSIS — K639 Disease of intestine, unspecified: Secondary | ICD-10-CM | POA: Diagnosis not present

## 2023-06-04 DIAGNOSIS — K635 Polyp of colon: Secondary | ICD-10-CM | POA: Diagnosis not present

## 2023-06-04 DIAGNOSIS — Z1211 Encounter for screening for malignant neoplasm of colon: Secondary | ICD-10-CM

## 2023-06-04 DIAGNOSIS — Z860101 Personal history of adenomatous and serrated colon polyps: Secondary | ICD-10-CM | POA: Insufficient documentation

## 2023-06-04 DIAGNOSIS — G35 Multiple sclerosis: Secondary | ICD-10-CM | POA: Insufficient documentation

## 2023-06-04 DIAGNOSIS — Z860100 Personal history of colon polyps, unspecified: Secondary | ICD-10-CM

## 2023-06-04 HISTORY — PX: COLONOSCOPY WITH PROPOFOL: SHX5780

## 2023-06-04 SURGERY — COLONOSCOPY WITH PROPOFOL
Anesthesia: General

## 2023-06-04 MED ORDER — PROPOFOL 500 MG/50ML IV EMUL
INTRAVENOUS | Status: DC | PRN
  Administered 2023-06-04: 150 ug/kg/min via INTRAVENOUS

## 2023-06-04 MED ORDER — STERILE WATER FOR IRRIGATION IR SOLN
Status: DC | PRN
  Administered 2023-06-04: 60 mL

## 2023-06-04 MED ORDER — PROPOFOL 10 MG/ML IV BOLUS
INTRAVENOUS | Status: DC | PRN
  Administered 2023-06-04: 60 mg via INTRAVENOUS
  Administered 2023-06-04: 10 mg via INTRAVENOUS

## 2023-06-04 MED ORDER — SODIUM CHLORIDE 0.9 % IV SOLN: INTRAVENOUS | Status: DC

## 2023-06-04 MED ORDER — LIDOCAINE HCL (CARDIAC) PF 100 MG/5ML IV SOSY
PREFILLED_SYRINGE | INTRAVENOUS | Status: DC | PRN
  Administered 2023-06-04: 50 mg via INTRAVENOUS

## 2023-06-04 NOTE — Anesthesia Preprocedure Evaluation (Signed)
Anesthesia Evaluation  Patient identified by MRN, date of birth, ID band Patient awake    Reviewed: Allergy & Precautions, NPO status , Patient's Chart, lab work & pertinent test results  History of Anesthesia Complications Negative for: history of anesthetic complications  Airway Mallampati: II  TM Distance: >3 FB Neck ROM: full    Dental no notable dental hx.    Pulmonary neg pulmonary ROS   Pulmonary exam normal        Cardiovascular negative cardio ROS Normal cardiovascular exam     Neuro/Psych Seizures -, Well Controlled,  MS  Neuromuscular disease  negative psych ROS   GI/Hepatic negative GI ROS, Neg liver ROS,,,  Endo/Other  negative endocrine ROS    Renal/GU negative Renal ROS  negative genitourinary   Musculoskeletal   Abdominal   Peds  Hematology negative hematology ROS (+)   Anesthesia Other Findings Past Medical History: No date: Chickenpox No date: Multiple sclerosis (HCC) No date: Rosacea No date: Seizure [facility])  Past Surgical History: No date: COLONOSCOPY 05/27/2020: COLONOSCOPY WITH PROPOFOL; N/A     Comment:  Procedure: COLONOSCOPY WITH PROPOFOL;  Surgeon: Wyline Mood, MD;  Location: [facility] ENDOSCOPY;  Service:               Gastroenterology;  Laterality: N/A; 12/24/2019: GASTROCNEMIUS RECESSION; Left     Reproductive/Obstetrics negative OB ROS                             Anesthesia Physical Anesthesia Plan  ASA: 3  Anesthesia Plan: General   Post-op Pain Management: Minimal or no pain anticipated   Induction: Intravenous  PONV Risk Score and Plan: 2 and Propofol infusion and TIVA  Airway Management Planned: Natural Airway and Nasal Cannula  Additional Equipment:   Intra-op Plan:   Post-operative Plan:   Informed Consent: I have reviewed the patients History and Physical, chart, labs and discussed the procedure including the risks,  benefits and alternatives for the proposed anesthesia with the patient or authorized representative who has indicated his/her understanding and acceptance.     Dental Advisory Given  Plan Discussed with: Anesthesiologist, CRNA and Surgeon  Anesthesia Plan Comments: (Patient consented for risks of anesthesia including but not limited to:  - adverse reactions to medications - risk of airway placement if required - damage to eyes, teeth, lips or other oral mucosa - nerve damage due to positioning  - sore throat or hoarseness - Damage to heart, brain, nerves, lungs, other parts of body or loss of life  Patient voiced understanding and assent.)       Anesthesia Quick Evaluation

## 2023-06-04 NOTE — Transfer of Care (Signed)
Immediate Anesthesia Transfer of Care Note  Patient: Natasha George  Procedure(s) Performed: COLONOSCOPY WITH PROPOFOL  Patient Location: Endoscopy Unit  Anesthesia Type:General  Level of Consciousness: awake, alert , and oriented  Airway & Oxygen Therapy: Patient Spontanous Breathing  Post-op Assessment: Report given to RN and Post -op Vital signs reviewed and stable  Post vital signs: Reviewed and stable  Last Vitals:  Vitals Value Taken Time  BP    Temp    Pulse 65 06/04/23 1109  Resp 18 06/04/23 1109  SpO2 100 % 06/04/23 1109  Vitals shown include unfiled device data.  Last Pain:  Vitals:   06/04/23 1024  TempSrc: Temporal  PainSc: 0-No pain         Complications: No notable events documented.

## 2023-06-04 NOTE — Op Note (Signed)
[facility] Gastroenterology Patient Name: Natasha George Procedure Date: 06/04/2023 10:44 AM MRN: [id number] Account #: [id number] Date of Birth: [birth year] Admit Type: Outpatient Age: 67 Room: [facility] ENDO ROOM 4 Gender: Female Note Status: Finalized Instrument Name: Peds Colonoscope [id number] Procedure:             Colonoscopy Indications:           Surveillance: Personal history of adenomatous polyps                         on last colonoscopy 3 years ago, Last colonoscopy:                         November 2021 Providers:             Toney Reil MD, MD Referring MD:          Doreene Nest (Referring MD) Medicines:             General Anesthesia Complications:         No immediate complications. Estimated blood loss: None. Procedure:             Pre-Anesthesia Assessment:                        - Prior to the procedure, a History and Physical was                         performed, and patient medications and allergies were                         reviewed. The patient is competent. The risks and                         benefits of the procedure and the sedation options and                         risks were discussed with the patient. All questions                         were answered and informed consent was obtained.                         Patient identification and proposed procedure were                         verified by the physician, the nurse, the                         anesthesiologist, the anesthetist and the technician                         in the pre-procedure area in the procedure room in the                         endoscopy suite. Mental Status Examination: alert and                         oriented. Airway Examination: normal oropharyngeal  airway and neck mobility. Respiratory Examination:                         clear to auscultation. CV Examination: normal.                         Prophylactic  Antibiotics: The patient does not require                         prophylactic antibiotics. Prior Anticoagulants: The                         patient has taken no anticoagulant or antiplatelet                         agents. ASA Grade Assessment: III - A patient with                         severe systemic disease. After reviewing the risks and                         benefits, the patient was deemed in satisfactory                         condition to undergo the procedure. The anesthesia                         plan was to use general anesthesia. Immediately prior                         to administration of medications, the patient was                         re-assessed for adequacy to receive sedatives. The                         heart rate, respiratory rate, oxygen saturations,                         blood pressure, adequacy of pulmonary ventilation, and                         response to care were monitored throughout the                         procedure. The physical status of the patient was                         re-assessed after the procedure.                        After obtaining informed consent, the colonoscope was                         passed under direct vision. Throughout the procedure,                         the patient's blood pressure, pulse, and oxygen  saturations were monitored continuously. The                         Colonoscope was introduced through the anus and                         advanced to the the cecum, identified by appendiceal                         orifice and ileocecal valve. The colonoscopy was                         performed without difficulty. The patient tolerated                         the procedure well. The quality of the bowel                         preparation was evaluated using the BBPS [facility] Bowel                         Preparation Scale) with scores of: Right Colon = 2                          (minor amount of residual staining, small fragments of                         stool and/or opaque liquid, but mucosa seen well),                         Transverse Colon = 3 (entire mucosa seen well with no                         residual staining, small fragments of stool or opaque                         liquid) and Left Colon = 3 (entire mucosa seen well                         with no residual staining, small fragments of stool or                         opaque liquid). The total BBPS score equals 8. The                         ileocecal valve, appendiceal orifice, and rectum were                         photographed. Findings:      The perianal and digital rectal examinations were normal. Pertinent       negatives include normal sphincter tone and no palpable rectal lesions.      A diminutive polyp was found in the transverse colon. The polyp was       sessile. The polyp was removed with a jumbo cold forceps. Resection and       retrieval were complete. Estimated blood loss: none.      External hemorrhoids were  found during retroflexion. The hemorrhoids       were medium-sized. Impression:            - One diminutive polyp in the transverse colon,                         removed with a jumbo cold forceps. Resected and                         retrieved.                        - External hemorrhoids. Recommendation:        - Discharge patient to home (with escort).                        - Resume previous diet today.                        - Continue present medications.                        - Await pathology results.                        - Repeat colonoscopy in 5 years for surveillance. Procedure Code(s):     --- Professional ---                        [phone], Colonoscopy, flexible; with biopsy, single or                         multiple Diagnosis Code(s):     --- Professional ---                        Z86.010, Personal history of colonic polyps                        D12.3,  Benign neoplasm of transverse colon (hepatic                         flexure or splenic flexure)                        K64.4, Residual hemorrhoidal skin tags CPT copyright 2022 American Medical Association. All rights reserved. The codes documented in this report are preliminary and upon coder review may  be revised to meet current compliance requirements. Dr. Libby Maw Toney Reil MD, MD 06/04/2023 11:09:18 AM This report has been signed electronically. Number of Addenda: 0 Note Initiated On: 06/04/2023 10:44 AM Scope Withdrawal Time: 0 hours 10 minutes 42 seconds  Total Procedure Duration: 0 hours 15 minutes 30 seconds  Estimated Blood Loss:  Estimated blood loss: none.      [facility]

## 2023-06-04 NOTE — Anesthesia Procedure Notes (Signed)
Procedure Name: MAC Date/Time: 06/04/2023 10:38 AM  Performed by: Hezzie Bump, CRNAPre-anesthesia Checklist: Patient identified, Emergency Drugs available, Suction available and Patient being monitored Patient Re-evaluated:Patient Re-evaluated prior to induction Oxygen Delivery Method: Nasal cannula Induction Type: IV induction Placement Confirmation: positive ETCO2

## 2023-06-04 NOTE — Anesthesia Postprocedure Evaluation (Signed)
Anesthesia Post Note  Patient: Natasha George  Procedure(s) Performed: COLONOSCOPY WITH PROPOFOL  Patient location during evaluation: Endoscopy Anesthesia Type: General Level of consciousness: awake and alert Pain management: pain level controlled Vital Signs Assessment: post-procedure vital signs reviewed and stable Respiratory status: spontaneous breathing, nonlabored ventilation, respiratory function stable and patient connected to nasal cannula oxygen Cardiovascular status: blood pressure returned to baseline and stable Postop Assessment: no apparent nausea or vomiting Anesthetic complications: no   No notable events documented.   Last Vitals:  Vitals:   06/04/23 1119 06/04/23 1126  BP:  122/60  Pulse: 66 70  Resp:    Temp:    SpO2: 100% 99%    Last Pain:  Vitals:   06/04/23 1126  TempSrc:   PainSc: 0-No pain                 Louie Boston

## 2023-06-04 NOTE — H&P (Signed)
Arlyss Repress, MD [address]  [address]  [place], [place] [address]  Main: [phone]  Fax: [phone] Pager: [phone]  Primary Care Physician:  Doreene Nest, NP Primary Gastroenterologist:  Dr. Arlyss Repress  Pre-Procedure History & Physical: HPI:  Natasha George is a 67 y.o. female is here for an colonoscopy.   Past Medical History:  Diagnosis Date   Chickenpox    Multiple sclerosis (HCC)    Rosacea    Seizure (HCC)     Past Surgical History:  Procedure Laterality Date   COLONOSCOPY     COLONOSCOPY WITH PROPOFOL N/A 05/27/2020   Procedure: COLONOSCOPY WITH PROPOFOL;  Surgeon: Wyline Mood, MD;  Location: [facility] ENDOSCOPY;  Service: Gastroenterology;  Laterality: N/A;   GASTROCNEMIUS RECESSION Left 12/24/2019    Prior to Admission medications   Medication Sig Start Date End Date Taking? Authorizing Provider  Cholecalciferol 125 MCG (5000 UT) TABS cholecalciferol (vitamin D3) 125 mcg (5,000 unit) tablet 07/17/1968  Yes [provider]  Clocortolone Pivalate (CLODERM) 0.1 % cream Apply topically. 05/02/22  Yes [provider]  diclofenac (VOLTAREN) 75 MG EC tablet Take 1 tablet (75 mg total) by mouth 2 (two) times daily. 04/13/23  Yes Bedsole, Amy E, MD  hydrocortisone 2.5 % ointment APPLY NIGHTLY TO EYELIDS 08/09/22  Yes [provider]  levETIRAcetam (KEPPRA) 500 MG tablet Taking 500 mg by mouth in the morning and 1000 mg in the evening 01/17/18  Yes [provider]  Multiple Vitamins-Minerals (MULTIVITAMIN WOMEN PO) Take by mouth.   Yes [provider]  VUMERITY 231 MG CPDR Take by mouth. Taking 2 tablets by mouth in the am and 2 tablets in the pm   Yes [provider]  predniSONE (DELTASONE) 20 MG tablet Take two tablets my mouth once daily in the morning for four days, then one tablet once daily in the morning for four days. Patient not taking: Reported on 06/04/2023 04/18/23   Doreene Nest, NP     Allergies as of 06/01/2023 - Review Complete 04/18/2023  Allergen Reaction Noted   Doxycycline Rash 08/09/2021   Metronidazole Itching 08/09/2021    Family History  Adopted: Yes  Problem Relation Age of Onset   Breast cancer Neg Hx     Social History   Socioeconomic History   Marital status: Married    Spouse name: Not on file   Number of children: Not on file   Years of education: Not on file   Highest education level: Not on file  Occupational History   Not on file  Tobacco Use   Smoking status: Never   Smokeless tobacco: Never  Vaping Use   Vaping status: Never Used  Substance and Sexual Activity   Alcohol use: Yes    Alcohol/week: 4.0 standard drinks of alcohol    Types: 4 Glasses of wine per week    Comment: socially   Drug use: Never   Sexual activity: Yes    Partners: Male    Comment: Married  Other Topics Concern   Not on file  Social History Narrative   Not on file   Social Determinants of Health   Financial Resource Strain: Not on file  Food Insecurity: Not on file  Transportation Needs: Not on file  Physical Activity: Not on file  Stress: Not on file  Social Connections: Not on file  Intimate Partner Violence: Not on file    Review of Systems: See HPI, otherwise negative ROS  Physical  Exam: BP 127/78   Pulse 70   Temp (!) 96.7 F (35.9 C) (Temporal)   Resp 18   Ht 5\' 3"  (1.6 m)   Wt 57.3 kg   SpO2 91%   BMI 22.37 kg/m  General:   Alert,  pleasant and cooperative in NAD Head:  Normocephalic and atraumatic. Neck:  Supple; no masses or thyromegaly. Lungs:  Clear throughout to auscultation.    Heart:  Regular rate and rhythm. Abdomen:  Soft, nontender and nondistended. Normal bowel sounds, without guarding, and without rebound.   Neurologic:  Alert and  oriented x4;  grossly normal neurologically.  Impression/Plan: Natasha George is here for an colonoscopy to be performed for h/o colon adenomas  Risks, benefits,  limitations, and alternatives regarding  colonoscopy have been reviewed with the patient.  Questions have been answered.  All parties agreeable.   Lannette Donath, MD  06/04/2023, 10:29 AM

## 2023-06-05 ENCOUNTER — Encounter: Payer: Self-pay | Admitting: Gastroenterology

## 2023-06-07 ENCOUNTER — Encounter: Payer: Self-pay | Admitting: Gastroenterology

## 2023-06-07 LAB — SURGICAL PATHOLOGY

## 2023-06-20 ENCOUNTER — Telehealth: Payer: Self-pay | Admitting: Gastroenterology

## 2023-06-20 NOTE — Telephone Encounter (Signed)
The patient would like to keep getting her procedure done by Dr.Anna.

## 2023-07-06 ENCOUNTER — Telehealth: Payer: Self-pay | Admitting: *Deleted

## 2023-07-06 NOTE — Telephone Encounter (Signed)
Pt called back returning Kelli's call. Call back # [phone]

## 2023-07-06 NOTE — Telephone Encounter (Signed)
Unable to reach patient. Left voicemail to return call to our office.   

## 2023-07-06 NOTE — Telephone Encounter (Signed)
Copied from CRM [id number]. Topic: Clinical - Medical Advice >> Jul 06, 2023  1:11 PM Sim Boast F wrote: Reason for CRM: Patient requests a call back from Vernona Rieger regarding her hip treatment. She tried to use MyChart but it's currently having issues.

## 2023-07-09 NOTE — Telephone Encounter (Signed)
Called and spoke with patient, she had bone density scan done recently and would like to discuss treatment options. Patient stated she was bringing by copy of Bone density report. Scheduled virtual visit on 01/03 to discuss

## 2023-07-16 ENCOUNTER — Telehealth: Payer: Self-pay

## 2023-07-16 NOTE — Telephone Encounter (Signed)
Called and advised patient we do have bone density report scanned into chart.

## 2023-07-16 NOTE — Telephone Encounter (Signed)
Copied from CRM [id number]. Topic: Clinical - Lab/Test Results >> Jul 16, 2023  9:21 AM Isabell A wrote: Reason for CRM: Patient is calling to confirm if we received her bone density results & all the paperwork needed for her upcoming appointment.

## 2023-07-19 ENCOUNTER — Ambulatory Visit: Payer: Self-pay | Admitting: Primary Care

## 2023-07-19 NOTE — Telephone Encounter (Signed)
 Copied from CRM [id number]. Topic: Clinical - Pink Word Triage >> Jul 19, 2023  8:52 AM Kinnie H wrote: Reason for Triage: patient experiencing uti symptoms   Chief Complaint: UTI symptoms and bladder fullness with potential urinary retention Symptoms: Pain with urination, back pain, lower abdominal pain, bloating, feeling of fullness in bladder, only droplets when urinating, urgency, frequency, discolored urine Frequency: Continual Pertinent Negatives: Patient denies straight blood in urine Disposition: [x] ED /[] Urgent Care (no appt availability in office) / [] Appointment(In office/virtual)/ []  [facility]/ [] Home Care/ [] Refused Recommended Disposition /[] [place] [facility]/ []  Follow-up with PCP Additional Notes: Pt reporting that her pain is between 6 and 7 out of 10, very uncomfortable, pt feel like pants are too tight, feel achy in lower abdomen. Pt also reporting back pain and almost get a burny, chilled sensation when urinate. Pt reporting that during the night, woke up 3x with such urgency to go to bathroom even though didn't have a lot of water  last night before bed. Pt reporting that she urinated during the night every couple hours, only droplets, then this morning an urge but nothing really happened. Pt confirms that her urinations are not as much as normal, urinated 3x not normal, just a little bit, pt confirms it feels very full in bladder. Pt reporting pain each time and other pains between urination as well. Pt reporting that painful urination started last night. Pt reporting hx of UTI before and it took 2 rounds of antibiotics to get rid of it. Pt reporting she felt yucky yesterday, felt bloaty kind of crampy in lower abdomen, then during the night, woke up 3x with such urgency to go to bathroom but just drops, reporting that she could barely get to the bathroom in time. Pt reporting that she has also noticed pinky brown color to urine and pinky  flecks on paper. Pt reporting back pain that started when woke up yesterday and day before but thought from recent hip aquatic PT appt. Pt confirms that her bladder feels very full, feel like pants are too tight, feel achy in lower abdomen. Confirmed pt only urinating droplets or much smaller amounts for over 4 hours, feeling of fullness in bladder. Advised pt go to ED for symptoms. Pt requesting appt with other office or UC, asking if should go to OB/GYN for symptoms. Advised most appropriate care for potential urinary retention in ED. Connected to [facility] for [employer] per pt request, no appts available today and no triage nurse at location. Strongly advised pt go to ED. Pt reporting that she will head to ED.  Reason for Disposition  [1] Unable to urinate (or only a few drops) > 4 hours AND [2] bladder feels very full (e.g., palpable bladder or strong urge to urinate)  Answer Assessment - Initial Assessment Questions 1. SEVERITY: How bad is the pain?  (e.g., Scale 1-10; mild, moderate, or severe)   - MILD (1-3): complains slightly about urination hurting   - MODERATE (4-7): interferes with normal activities     - SEVERE (8-10): excruciating, unwilling or unable to urinate because of the pain      Pt reporting that her pain is between 6 and 7 out of 10, very uncomfortable, pt feel like pants are too tight, feel achy in lower abdomen. Pt also reporting back pain and almost get a burny, chilled sensation when urinate. 2. FREQUENCY: How many times have you had painful urination today?      Pt reporting that during  the night, woke up 3x with such urgency to go to bathroom even though didn't have a lot of water  last night before bed. Pt reporting that she urinated during the night every couple hours, only droplets, then this morning an urge but nothing really happened. Pt confirms that her urinations are not as much as normal, urinated 3x not normal, just a little bit, pt  confirms it feels very full in bladder. 3. PATTERN: Is pain present every time you urinate or just sometimes?      Pt reporting pain each time and other pains between urination as well. 4. ONSET: When did the painful urination start?      Pt reporting that painful urination started last night. 6. PAST UTI: Have you had a urine infection before? If Yes, ask: When was the last time? and What happened that time?      Pt reporting hx of UTI before and it took 2 rounds of antibiotics to get rid of it. 8. OTHER SYMPTOMS: Do you have any other symptoms? (e.g., blood in urine, flank pain, genital sores, urgency, vaginal discharge)     Pt reporting she felt yucky yesterday, felt bloaty kind of crampy in lower abdomen, then during the night, woke up 3x with such urgency to go to bathroom but just drops, reporting that she could barely get to the bathroom in time. Pt reporting that she has also noticed pinky brown color to urine and pinky flecks on paper. Pt reporting back pain that started when woke up yesterday and day before but thought from recent hip aquatic PT appt. Pt confirms that her bladder feels very full, feel like pants are too tight, feel achy in lower abdomen.  Protocols used: Urination Pain - Female-A-AH

## 2023-07-19 NOTE — Telephone Encounter (Signed)
 Noted. Will await ED notes. Will also evaluate patient on 07/20/23 as scheduled.

## 2023-07-19 NOTE — Telephone Encounter (Signed)
 Noted - agree with disposition

## 2023-07-20 ENCOUNTER — Ambulatory Visit: Payer: Medicare Other | Admitting: Family Medicine

## 2023-07-20 ENCOUNTER — Encounter: Payer: Self-pay | Admitting: Primary Care

## 2023-07-20 ENCOUNTER — Ambulatory Visit (INDEPENDENT_AMBULATORY_CARE_PROVIDER_SITE_OTHER): Payer: Medicare Other | Admitting: Primary Care

## 2023-07-20 VITALS — BP 128/66 | HR 84 | Temp 97.3°F | Ht 63.0 in | Wt 130.0 lb

## 2023-07-20 DIAGNOSIS — M8080XS Other osteoporosis with current pathological fracture, unspecified site, sequela: Secondary | ICD-10-CM

## 2023-07-20 DIAGNOSIS — R350 Frequency of micturition: Secondary | ICD-10-CM | POA: Insufficient documentation

## 2023-07-20 DIAGNOSIS — M810 Age-related osteoporosis without current pathological fracture: Secondary | ICD-10-CM | POA: Insufficient documentation

## 2023-07-20 LAB — POC URINALSYSI DIPSTICK (AUTOMATED)
Bilirubin, UA: NEGATIVE
Blood, UA: POSITIVE
Glucose, UA: NEGATIVE
Ketones, UA: POSITIVE
Nitrite, UA: POSITIVE
Protein, UA: POSITIVE — AB
Spec Grav, UA: 1.015 (ref 1.010–1.025)
Urobilinogen, UA: 0.2 U/dL
pH, UA: 6 (ref 5.0–8.0)

## 2023-07-20 MED ORDER — SULFAMETHOXAZOLE-TRIMETHOPRIM 800-160 MG PO TABS: 1.0000 | ORAL_TABLET | Freq: Two times a day (BID) | ORAL | 0 refills | Status: DC

## 2023-07-20 NOTE — Assessment & Plan Note (Signed)
 Symptoms and urinalysis consistent for cystitis. Lower suspicion for renal stones.  UA today with 3+ leuks, positive nitrites, 3+ blood. Urine culture ordered and pending.   Start Bactrim  DS (sulfamethoxazole /trimethoprim ) tablets for urinary tract infection. Take 1 tablet by mouth twice daily for 3 days. He will update if no improvement.

## 2023-07-20 NOTE — Patient Instructions (Signed)
 Start Bactrim  DS (sulfamethoxazole /trimethoprim ) tablets for urinary tract infection. Take 1 tablet by mouth twice daily for 3 days.  Continue to hydrate well with water .  Continue vitamin D and increased dietary intake of calcium .  It was a pleasure to see you today!

## 2023-07-20 NOTE — Assessment & Plan Note (Signed)
 Reviewed bone density scan results from December 2024.  Reviewed both Evenity and Tymlos including mechanism of action, potential side effects, instructions for administration.  Encouraged calcium  and vitamin D.   I recommended Evenity.  She will update regarding her decision for bone density treatment.

## 2023-07-20 NOTE — Progress Notes (Signed)
 Subjective:    Patient ID: Natasha George, female    DOB: 1956-05-22, 68 y.o.   MRN: [id number]  Urinary Frequency  Associated symptoms include frequency and hematuria.    Natasha George is a very pleasant 68 y.o. female with a history of multiple sclerosis, seizure disorder, lumbar radiculopathy who presents today to discuss multiple concerns.   1) Urinary Frequency: Symptom onset two days ago with urinary frequency, bladder pressure, lower back pain, dark colored urine with pink flecks. She contacted our office yesterday with increased severity of the above-mentioned symptoms.  Recommendations were for urgent evaluation.  Today she continues to experience her symptoms which have improved. She denies flank pain, fevers, vaginal itching. Her pelvic pressure has improved. She's been hydrating with water  and cranberry juice and taking Ibuprofen.   2) Osteoporosis: She completed a bone density scan per [facility] in December 2024 which revealed a T-score of -2.7 in the left femoral neck.  Recommendation during that visit was treatment with Evenity or Tymlos.   Bone density completed due to recent left medial tibial plateau fracture. She is contemplating Tymlos vs Evenity. She has several questions about each medication. She is taking supplemental vitamin D, is working to increase dietary calcium . She is completing aquatic physical therapy now for her medial tibial plateau fracture.  She will be transitioning to land physical therapy soon.  Review of Systems  Constitutional:  Negative for fever.  Gastrointestinal:  Negative for abdominal pain.  Genitourinary:  Positive for frequency, hematuria and pelvic pain. Negative for dysuria.         Past Medical History:  Diagnosis Date   Chickenpox    Multiple sclerosis (HCC)    Rosacea    Seizure (HCC)     Social History   Socioeconomic History   Marital status: Married    Spouse name: Not on file   Number of  children: Not on file   Years of education: Not on file   Highest education level: Not on file  Occupational History   Not on file  Tobacco Use   Smoking status: Never   Smokeless tobacco: Never  Vaping Use   Vaping status: Never Used  Substance and Sexual Activity   Alcohol use: Yes    Alcohol/week: 4.0 standard drinks of alcohol    Types: 4 Glasses of wine per week    Comment: socially   Drug use: Never   Sexual activity: Yes    Partners: Male    Comment: Married  Other Topics Concern   Not on file  Social History Narrative   Not on file   Social Drivers of Health   Financial Resource Strain: Not on file  Food Insecurity: Not on file  Transportation Needs: Not on file  Physical Activity: Not on file  Stress: Not on file  Social Connections: Not on file  Intimate Partner Violence: Not on file    Past Surgical History:  Procedure Laterality Date   COLONOSCOPY     COLONOSCOPY WITH PROPOFOL  N/A 05/27/2020   Procedure: COLONOSCOPY WITH PROPOFOL ;  Surgeon: Therisa Bi, MD;  Location: [facility] ENDOSCOPY;  Service: Gastroenterology;  Laterality: N/A;   COLONOSCOPY WITH PROPOFOL  N/A 06/04/2023   Procedure: COLONOSCOPY WITH PROPOFOL ;  Surgeon: Unk Corinn Skiff, MD;  Location: [facility] ENDOSCOPY;  Service: Gastroenterology;  Laterality: N/A;   GASTROCNEMIUS RECESSION Left 12/24/2019    Family History  Adopted: Yes  Problem Relation Age of Onset   Breast cancer Neg Hx  Allergies  Allergen Reactions   Doxycycline  Rash    Lip swelling/burning sensation No sob     Metronidazole  Itching    Current Outpatient Medications on File Prior to Visit  Medication Sig Dispense Refill   Cholecalciferol 125 MCG (5000 UT) TABS cholecalciferol (vitamin D3) 125 mcg (5,000 unit) tablet     Clocortolone Pivalate (CLODERM) 0.1 % cream Apply topically.     diclofenac  (VOLTAREN ) 75 MG EC tablet Take 1 tablet (75 mg total) by mouth 2 (two) times daily. 30 tablet 0   hydrocortisone  2.5 %  ointment APPLY NIGHTLY TO EYELIDS     levETIRAcetam (KEPPRA) 500 MG tablet Taking 500 mg by mouth in the morning and 1000 mg in the evening     Multiple Vitamins-Minerals (MULTIVITAMIN WOMEN PO) Take by mouth.     VUMERITY 231 MG CPDR Take by mouth. Taking 2 tablets by mouth in the am and 2 tablets in the pm     No current facility-administered medications on file prior to visit.    BP 128/66   Pulse 84   Temp (!) 97.3 F (36.3 C) (Temporal)   Ht 5' 3 (1.6 m)   Wt 130 lb (59 kg)   SpO2 96%   BMI 23.03 kg/m  Objective:   Physical Exam Cardiovascular:     Rate and Rhythm: Normal rate and regular rhythm.  Pulmonary:     Effort: Pulmonary effort is normal.     Breath sounds: Normal breath sounds.  Abdominal:     Tenderness: There is no right CVA tenderness or left CVA tenderness.  Musculoskeletal:     Cervical back: Neck supple.  Skin:    General: Skin is warm and dry.  Neurological:     Mental Status: She is alert and oriented to person, place, and time.  Psychiatric:        Mood and Affect: Mood normal.           Assessment & Plan:  Urinary frequency Assessment & Plan: Symptoms and urinalysis consistent for cystitis. Lower suspicion for renal stones.  UA today with 3+ leuks, positive nitrites, 3+ blood. Urine culture ordered and pending.   Start Bactrim  DS (sulfamethoxazole /trimethoprim ) tablets for urinary tract infection. Take 1 tablet by mouth twice daily for 3 days. He will update if no improvement.  Orders: -     POCT Urinalysis Dipstick (Automated) -     Urine Culture -     Sulfamethoxazole -Trimethoprim ; Take 1 tablet by mouth 2 (two) times daily. For urinary tract infection.  Dispense: 6 tablet; Refill: 0  Localized osteoporosis with current pathological fracture, sequela Assessment & Plan: Reviewed bone density scan results from December 2024.  Reviewed both Evenity and Tymlos including mechanism of action, potential side effects, instructions for  administration.  Encouraged calcium  and vitamin D.   I recommended Evenity.  She will update regarding her decision for bone density treatment.         Phi Avans K Mayo Owczarzak, NP

## 2023-07-22 LAB — URINE CULTURE
MICRO NUMBER:: 15915071
SPECIMEN QUALITY:: ADEQUATE

## 2023-08-22 ENCOUNTER — Other Ambulatory Visit: Payer: Self-pay

## 2023-08-22 ENCOUNTER — Encounter: Payer: Self-pay | Admitting: Ophthalmology

## 2023-08-31 NOTE — Discharge Instructions (Signed)

## 2023-09-03 NOTE — Anesthesia Preprocedure Evaluation (Signed)
Anesthesia Evaluation  Patient identified by MRN, date of birth, ID band Patient awake    Reviewed: Allergy & Precautions, H&P , NPO status , Patient's Chart, lab work & pertinent test results  Airway Mallampati: II  TM Distance: >3 FB     Dental   Pulmonary neg pulmonary ROS          Cardiovascular negative cardio ROS      Neuro/Psych Seizures -,   Neuromuscular disease negative neurological ROS  negative psych ROS   GI/Hepatic negative GI ROS, Neg liver ROS,,,  Endo/Other  negative endocrine ROS    Renal/GU negative Renal ROS  negative genitourinary   Musculoskeletal negative musculoskeletal ROS (+)    Abdominal   Peds negative pediatric ROS (+)  Hematology negative hematology ROS (+)   Anesthesia Other Findings Seizure  Multiple sclerosis   Rosacea Osteopenia  Osteoporosis    Reproductive/Obstetrics negative OB ROS                              Anesthesia Physical Anesthesia Plan  ASA: 3  Anesthesia Plan: MAC   Post-op Pain Management:    Induction: Intravenous  PONV Risk Score and Plan:   Airway Management Planned: Natural Airway and Nasal Cannula  Additional Equipment:   Intra-op Plan:   Post-operative Plan:   Informed Consent: I have reviewed the patients History and Physical, chart, labs and discussed the procedure including the risks, benefits and alternatives for the proposed anesthesia with the patient or authorized representative who has indicated his/her understanding and acceptance.     Dental Advisory Given  Plan Discussed with: Anesthesiologist, CRNA and Surgeon  Anesthesia Plan Comments: (Patient consented for risks of anesthesia including but not limited to:  - adverse reactions to medications - damage to eyes, teeth, lips or other oral mucosa - nerve damage due to positioning  - sore throat or hoarseness - Damage to heart, brain, nerves, lungs,  other parts of body or loss of life  Patient voiced understanding and assent.)       Anesthesia Quick Evaluation

## 2023-09-04 ENCOUNTER — Ambulatory Visit
Admission: RE | Admit: 2023-09-04 | Discharge: 2023-09-04 | Disposition: A | Discharge status: Home / Self Care | Payer: Medicare Other | Attending: Ophthalmology | Admitting: Ophthalmology

## 2023-09-04 ENCOUNTER — Encounter: Payer: Self-pay | Admitting: Ophthalmology

## 2023-09-04 ENCOUNTER — Ambulatory Visit: Payer: Medicare Other | Admitting: Anesthesiology

## 2023-09-04 ENCOUNTER — Other Ambulatory Visit: Payer: Self-pay

## 2023-09-04 ENCOUNTER — Encounter
Admission: RE | Disposition: A | Discharge status: Home / Self Care | Payer: Self-pay | Source: Home / Self Care | Attending: Ophthalmology

## 2023-09-04 DIAGNOSIS — H2511 Age-related nuclear cataract, right eye: Secondary | ICD-10-CM | POA: Diagnosis present

## 2023-09-04 DIAGNOSIS — R569 Unspecified convulsions: Secondary | ICD-10-CM | POA: Diagnosis not present

## 2023-09-04 HISTORY — DX: Other specified disorders of bone density and structure, unspecified site: M85.80

## 2023-09-04 HISTORY — DX: Age-related osteoporosis without current pathological fracture: M81.0

## 2023-09-04 HISTORY — PX: CATARACT EXTRACTION W/PHACO: SHX586

## 2023-09-04 SURGERY — PHACOEMULSIFICATION, CATARACT, WITH IOL INSERTION
Anesthesia: Monitor Anesthesia Care | Laterality: Right

## 2023-09-04 MED ORDER — TETRACAINE HCL 0.5 % OP SOLN
1.0000 [drp] | OPHTHALMIC | Status: DC | PRN
  Administered 2023-09-04 (×2): 1 [drp] via OPHTHALMIC

## 2023-09-04 MED ORDER — TETRACAINE HCL 0.5 % OP SOLN
OPHTHALMIC | Status: AC
  Filled 2023-09-04: qty 4

## 2023-09-04 MED ORDER — BRIMONIDINE TARTRATE-TIMOLOL 0.2-0.5 % OP SOLN
OPHTHALMIC | Status: DC | PRN
  Administered 2023-09-04: 1 [drp] via OPHTHALMIC

## 2023-09-04 MED ORDER — MOXIFLOXACIN HCL 0.5 % OP SOLN
OPHTHALMIC | Status: DC | PRN
  Administered 2023-09-04: .2 mL via OPHTHALMIC

## 2023-09-04 MED ORDER — MIDAZOLAM HCL 2 MG/2ML IJ SOLN
INTRAMUSCULAR | Status: DC | PRN
Start: 2023-09-04 — End: 2023-09-04
  Administered 2023-09-04: 1 mg via INTRAVENOUS

## 2023-09-04 MED ORDER — ARMC OPHTHALMIC DILATING DROPS
OPHTHALMIC | Status: AC
  Filled 2023-09-04: qty 0.5

## 2023-09-04 MED ORDER — FENTANYL CITRATE (PF) 100 MCG/2ML IJ SOLN
INTRAMUSCULAR | Status: DC | PRN
  Administered 2023-09-04: 50 ug via INTRAVENOUS

## 2023-09-04 MED ORDER — SIGHTPATH DOSE#1 BSS IO SOLN
INTRAOCULAR | Status: DC | PRN
  Administered 2023-09-04: 15 mL via INTRAOCULAR

## 2023-09-04 MED ORDER — FENTANYL CITRATE (PF) 100 MCG/2ML IJ SOLN
INTRAMUSCULAR | Status: AC
  Filled 2023-09-04: qty 2

## 2023-09-04 MED ORDER — MIDAZOLAM HCL 2 MG/2ML IJ SOLN
INTRAMUSCULAR | Status: AC
  Filled 2023-09-04: qty 2

## 2023-09-04 MED ORDER — ARMC OPHTHALMIC DILATING DROPS
1.0000 | OPHTHALMIC | Status: DC | PRN
  Administered 2023-09-04 (×2): 1 via OPHTHALMIC

## 2023-09-04 MED ORDER — SIGHTPATH DOSE#1 NA CHONDROIT SULF-NA HYALURON 40-17 MG/ML IO SOLN
INTRAOCULAR | Status: DC | PRN
  Administered 2023-09-04: 1 mL via INTRAOCULAR

## 2023-09-04 MED ORDER — SIGHTPATH DOSE#1 BSS IO SOLN
INTRAOCULAR | Status: DC | PRN
  Administered 2023-09-04: 56 mL via OPHTHALMIC

## 2023-09-04 MED ORDER — SIGHTPATH DOSE#1 BSS IO SOLN
INTRAOCULAR | Status: DC | PRN
  Administered 2023-09-04: 2 mL

## 2023-09-04 SURGICAL SUPPLY — 10 items
CATARACT SUITE SIGHTPATH (MISCELLANEOUS) ×1
CYSTOTOME ANG REV CUT SHRT 25G (CUTTER) ×1
CYSTOTOME ANGL RVRS SHRT 25G (CUTTER) ×1 IMPLANT
FEE CATARACT SUITE SIGHTPATH (MISCELLANEOUS) ×1 IMPLANT
GLOVE BIOGEL PI IND STRL 8 (GLOVE) ×1 IMPLANT
GLOVE SURG LX STRL 8.0 MICRO (GLOVE) ×1 IMPLANT
LENS IOL TECNIS EYHANCE 15.5 (Intraocular Lens) IMPLANT
NDL FILTER BLUNT 18X1 1/2 (NEEDLE) ×1 IMPLANT
NEEDLE FILTER BLUNT 18X1 1/2 (NEEDLE) ×1
SYR 3ML LL SCALE MARK (SYRINGE) ×1 IMPLANT

## 2023-09-04 NOTE — Op Note (Signed)
PREOPERATIVE DIAGNOSIS:  Nuclear sclerotic cataract of the right eye.   POSTOPERATIVE DIAGNOSIS:  Cataract   OPERATIVE PROCEDURE:ORPROCALL@   SURGEON:  Galen Manila, MD.   ANESTHESIA:  Anesthesiologist: Marisue Humble, MD CRNA: Lanell Matar, CRNA  1.      Managed anesthesia care. 2.      0.51ml of Shugarcaine was instilled in the eye following the paracentesis.   COMPLICATIONS:  None.   TECHNIQUE:   Stop and chop   DESCRIPTION OF PROCEDURE:  The patient was examined and consented in the preoperative holding area where the aforementioned topical anesthesia was applied to the right eye and then brought back to the Operating Room where the right eye was prepped and draped in the usual sterile ophthalmic fashion and a lid speculum was placed. A paracentesis was created with the side port blade and the anterior chamber was filled with viscoelastic. A near clear corneal incision was performed with the steel keratome. A continuous curvilinear capsulorrhexis was performed with a cystotome followed by the capsulorrhexis forceps. Hydrodissection and hydrodelineation were carried out with BSS on a blunt cannula. The lens was removed in a stop and chop  technique and the remaining cortical material was removed with the irrigation-aspiration handpiece. The capsular bag was inflated with viscoelastic and the Technis [id number]  lens was placed in the capsular bag without complication. The remaining viscoelastic was removed from the eye with the irrigation-aspiration handpiece. The wounds were hydrated. The anterior chamber was flushed with BSS and the eye was inflated to physiologic pressure. 0.7ml of Vigamox was placed in the anterior chamber. The wounds were found to be water tight. The eye was dressed with Combigan. The patient was given protective glasses to wear throughout the day and a shield with which to sleep tonight. The patient was also given drops with which to begin a drop regimen today and  will follow-up with me in one day. Implant Name Type Inv. Item Serial No. Manufacturer Lot No. LRB No. Used Action  LENS IOL TECNIS EYHANCE 15.5 - [id number] Intraocular Lens LENS IOL TECNIS EYHANCE 15.5 [id number] [employer]  Right 1 Implanted   Procedure(s): CATARACT EXTRACTION PHACO AND INTRAOCULAR LENS PLACEMENT (IOC) RIGHT 6.89 00:35.0 (Right)  Electronically signed: Galen Manila 09/04/2023 8:04 AM

## 2023-09-04 NOTE — Transfer of Care (Signed)
Immediate Anesthesia Transfer of Care Note  Patient: Natasha George  Procedure(s) Performed: CATARACT EXTRACTION PHACO AND INTRAOCULAR LENS PLACEMENT (IOC) RIGHT 6.89 00:35.0 (Right)  Patient Location: PACU  Anesthesia Type:MAC  Level of Consciousness: awake, alert , and oriented  Airway & Oxygen Therapy: Patient Spontanous Breathing  Post-op Assessment: Report given to RN, Post -op Vital signs reviewed and stable, and Patient moving all extremities X 4  Post vital signs: Reviewed and stable  Last Vitals:  Vitals Value Taken Time  BP    Temp    Pulse    Resp    SpO2      Last Pain:  Vitals:   09/04/23 0805  TempSrc:   PainSc: (P) 0-No pain         Complications: No notable events documented.

## 2023-09-04 NOTE — H&P (Signed)
[facility]   Primary Care Physician:  Doreene Nest, NP Ophthalmologist: Dr. Druscilla Brownie  Pre-Procedure History & Physical: HPI:  Natasha George is a 68 y.o. female here for cataract surgery.   Past Medical History:  Diagnosis Date   Chickenpox    Multiple sclerosis (HCC)    Osteopenia    right hip   Osteoporosis    left hip   Rosacea    Seizure (HCC)    12 yrs ago, on keppra    Past Surgical History:  Procedure Laterality Date   COLONOSCOPY     COLONOSCOPY WITH PROPOFOL N/A 05/27/2020   Procedure: COLONOSCOPY WITH PROPOFOL;  Surgeon: Wyline Mood, MD;  Location: [facility] ENDOSCOPY;  Service: Gastroenterology;  Laterality: N/A;   COLONOSCOPY WITH PROPOFOL N/A 06/04/2023   Procedure: COLONOSCOPY WITH PROPOFOL;  Surgeon: Toney Reil, MD;  Location: [facility] ENDOSCOPY;  Service: Gastroenterology;  Laterality: N/A;   GASTROCNEMIUS RECESSION Left 12/24/2019    Prior to Admission medications   Medication Sig Start Date End Date Taking? Authorizing Provider  Cholecalciferol 125 MCG (5000 UT) TABS cholecalciferol (vitamin D3) 125 mcg (5,000 unit) tablet 07/17/1968  Yes [provider]  Clocortolone Pivalate (CLODERM) 0.1 % cream Apply topically as needed. 05/02/22  Yes [provider]  hydrocortisone 2.5 % ointment as needed. 08/09/22  Yes [provider]  levETIRAcetam (KEPPRA) 500 MG tablet Taking 500 mg by mouth in the morning and 1000 mg in the evening 01/17/18  Yes [provider]  Multiple Vitamins-Minerals (MULTIVITAMIN WOMEN PO) Take by mouth.   Yes [provider]  VUMERITY 231 MG CPDR Take by mouth. Taking 2 tablets by mouth in the am and 2 tablets in the pm   Yes [provider]  diclofenac (VOLTAREN) 75 MG EC tablet Take 1 tablet (75 mg total) by mouth 2 (two) times daily. Patient not taking: Reported on 08/22/2023 04/13/23   Excell Seltzer, MD  sulfamethoxazole-trimethoprim (BACTRIM DS) 800-160 MG tablet Take 1  tablet by mouth 2 (two) times daily. For urinary tract infection. Patient not taking: Reported on 08/22/2023 07/20/23   Doreene Nest, NP    Allergies as of 07/02/2023 - Review Complete 06/04/2023  Allergen Reaction Noted   Doxycycline Rash 08/09/2021   Metronidazole Itching 08/09/2021    Family History  Adopted: Yes  Problem Relation Age of Onset   Breast cancer Neg Hx     Social History   Socioeconomic History   Marital status: Married    Spouse name: Not on file   Number of children: Not on file   Years of education: Not on file   Highest education level: Not on file  Occupational History   Not on file  Tobacco Use   Smoking status: Never   Smokeless tobacco: Never  Vaping Use   Vaping status: Never Used  Substance and Sexual Activity   Alcohol use: Yes    Alcohol/week: 4.0 standard drinks of alcohol    Types: 4 Glasses of wine per week    Comment: socially   Drug use: Never   Sexual activity: Yes    Partners: Male    Comment: Married  Other Topics Concern   Not on file  Social History Narrative   Not on file   Social Drivers of Health   Financial Resource Strain: Not on file  Food Insecurity: Not on file  Transportation Needs: Not on file  Physical Activity: Not on file  Stress: Not on file  Social  Connections: Not on file  Intimate Partner Violence: Not on file    Review of Systems: See HPI, otherwise negative ROS  Physical Exam: BP (!) 150/64   Temp (!) 97.5 F (36.4 C) (Temporal)   Resp 11   Ht 5' 2.99" (1.6 m)   Wt 59.9 kg   SpO2 100%   BMI 23.41 kg/m  General:   Alert, cooperative in NAD Head:  Normocephalic and atraumatic. Respiratory:  Normal work of breathing. Cardiovascular:  RRR  Impression/Plan: Natasha George is here for cataract surgery.  Risks, benefits, limitations, and alternatives regarding cataract surgery have been reviewed with the patient.  Questions have been answered.  All parties agreeable.   Galen Manila, MD  09/04/2023, 7:44 AM

## 2023-09-04 NOTE — Anesthesia Postprocedure Evaluation (Signed)
Anesthesia Post Note  Patient: Jasmon A Rochin  Procedure(s) Performed: CATARACT EXTRACTION PHACO AND INTRAOCULAR LENS PLACEMENT (IOC) RIGHT 6.89 00:35.0 (Right)  Patient location during evaluation: PACU Anesthesia Type: MAC Level of consciousness: awake and alert Pain management: pain level controlled Vital Signs Assessment: post-procedure vital signs reviewed and stable Respiratory status: spontaneous breathing, nonlabored ventilation, respiratory function stable and patient connected to nasal cannula oxygen Cardiovascular status: stable and blood pressure returned to baseline Postop Assessment: no apparent nausea or vomiting Anesthetic complications: no   No notable events documented.   Last Vitals:  Vitals:   09/04/23 0805 09/04/23 0810  BP: 131/73 121/73  Pulse: 64 64  Resp: 14 15  Temp: (!) 36.1 C   SpO2: 100% (!) 87%    Last Pain:  Vitals:   09/04/23 0810  TempSrc:   PainSc: 0-No pain                 Marisue Humble

## 2023-09-05 ENCOUNTER — Encounter: Payer: Self-pay | Admitting: Ophthalmology

## 2023-09-06 NOTE — Anesthesia Preprocedure Evaluation (Addendum)
 Anesthesia Evaluation  Patient identified by MRN, date of birth, ID band Patient awake    Reviewed: Allergy & Precautions, H&P , NPO status , Patient's Chart, lab work & pertinent test results  Airway Mallampati: II  TM Distance: >3 FB Neck ROM: Full    Dental no notable dental hx.    Pulmonary neg pulmonary ROS   Pulmonary exam normal breath sounds clear to auscultation       Cardiovascular negative cardio ROS Normal cardiovascular exam Rhythm:Regular Rate:Normal     Neuro/Psych Seizures -,   Neuromuscular disease negative neurological ROS  negative psych ROS   GI/Hepatic negative GI ROS, Neg liver ROS,,,  Endo/Other  negative endocrine ROS    Renal/GU negative Renal ROS  negative genitourinary   Musculoskeletal negative musculoskeletal ROS (+)    Abdominal   Peds negative pediatric ROS (+)  Hematology negative hematology ROS (+)   Anesthesia Other Findings Previous cataract surgery 09-04-23 Dr. Juel Burrow anesthesiologist  Previous cataract surgery  Seizure  Multiple sclerosis   Rosacea Osteopenia  Osteoporosis    Reproductive/Obstetrics negative OB ROS                              Anesthesia Physical Anesthesia Plan  ASA: 3  Anesthesia Plan: MAC   Post-op Pain Management:    Induction: Intravenous  PONV Risk Score and Plan:   Airway Management Planned: Natural Airway and Nasal Cannula  Additional Equipment:   Intra-op Plan:   Post-operative Plan:   Informed Consent: I have reviewed the patients History and Physical, chart, labs and discussed the procedure including the risks, benefits and alternatives for the proposed anesthesia with the patient or authorized representative who has indicated his/her understanding and acceptance.     Dental Advisory Given  Plan Discussed with: Anesthesiologist, CRNA and Surgeon  Anesthesia Plan Comments: (Patient consented for  risks of anesthesia including but not limited to:  - adverse reactions to medications - damage to eyes, teeth, lips or other oral mucosa - nerve damage due to positioning  - sore throat or hoarseness - Damage to heart, brain, nerves, lungs, other parts of body or loss of life  Patient voiced understanding and assent.)         Anesthesia Quick Evaluation

## 2023-09-17 NOTE — Discharge Instructions (Signed)

## 2023-09-18 ENCOUNTER — Encounter: Payer: Self-pay | Admitting: Ophthalmology

## 2023-09-18 ENCOUNTER — Other Ambulatory Visit: Payer: Self-pay

## 2023-09-18 ENCOUNTER — Ambulatory Visit: Payer: Self-pay | Admitting: Anesthesiology

## 2023-09-18 ENCOUNTER — Ambulatory Visit
Admission: RE | Admit: 2023-09-18 | Discharge: 2023-09-18 | Disposition: A | Discharge status: Home / Self Care | Payer: Medicare Other | Attending: Ophthalmology | Admitting: Ophthalmology

## 2023-09-18 ENCOUNTER — Encounter
Admission: RE | Disposition: A | Discharge status: Home / Self Care | Payer: Self-pay | Source: Home / Self Care | Attending: Ophthalmology

## 2023-09-18 DIAGNOSIS — H2512 Age-related nuclear cataract, left eye: Secondary | ICD-10-CM | POA: Insufficient documentation

## 2023-09-18 DIAGNOSIS — G35 Multiple sclerosis: Secondary | ICD-10-CM | POA: Insufficient documentation

## 2023-09-18 DIAGNOSIS — Z79899 Other long term (current) drug therapy: Secondary | ICD-10-CM | POA: Insufficient documentation

## 2023-09-18 HISTORY — PX: CATARACT EXTRACTION W/PHACO: SHX586

## 2023-09-18 SURGERY — PHACOEMULSIFICATION, CATARACT, WITH IOL INSERTION
Anesthesia: Monitor Anesthesia Care | Site: Eye | Laterality: Left

## 2023-09-18 MED ORDER — MIDAZOLAM HCL 2 MG/2ML IJ SOLN
INTRAMUSCULAR | Status: DC | PRN
  Administered 2023-09-18 (×2): 1 mg via INTRAVENOUS

## 2023-09-18 MED ORDER — SIGHTPATH DOSE#1 NA CHONDROIT SULF-NA HYALURON 40-17 MG/ML IO SOLN
INTRAOCULAR | Status: DC | PRN
  Administered 2023-09-18: 1 mL via INTRAOCULAR

## 2023-09-18 MED ORDER — TETRACAINE HCL 0.5 % OP SOLN
OPHTHALMIC | Status: AC
  Filled 2023-09-18: qty 4

## 2023-09-18 MED ORDER — ARMC OPHTHALMIC DILATING DROPS
1.0000 | OPHTHALMIC | Status: DC | PRN
Start: 2023-09-18 — End: 2023-09-18
  Administered 2023-09-18 (×3): 1 via OPHTHALMIC

## 2023-09-18 MED ORDER — BRIMONIDINE TARTRATE-TIMOLOL 0.2-0.5 % OP SOLN
OPHTHALMIC | Status: DC | PRN
  Administered 2023-09-18: 1 [drp] via OPHTHALMIC

## 2023-09-18 MED ORDER — SIGHTPATH DOSE#1 BSS IO SOLN
INTRAOCULAR | Status: DC | PRN
  Administered 2023-09-18: 2 mL

## 2023-09-18 MED ORDER — ARMC OPHTHALMIC DILATING DROPS
OPHTHALMIC | Status: AC
Start: 2023-09-18 — End: ?
  Filled 2023-09-18: qty 0.5

## 2023-09-18 MED ORDER — MOXIFLOXACIN HCL 0.5 % OP SOLN
OPHTHALMIC | Status: DC | PRN
  Administered 2023-09-18: .2 mL via OPHTHALMIC

## 2023-09-18 MED ORDER — TETRACAINE HCL 0.5 % OP SOLN
1.0000 [drp] | OPHTHALMIC | Status: DC | PRN
  Administered 2023-09-18 (×3): 1 [drp] via OPHTHALMIC

## 2023-09-18 MED ORDER — EPINEPHRINE PF 1 MG/ML IJ SOLN
INTRAMUSCULAR | Status: DC | PRN
  Administered 2023-09-18: 52 mL via OPHTHALMIC

## 2023-09-18 MED ORDER — FENTANYL CITRATE (PF) 100 MCG/2ML IJ SOLN
INTRAMUSCULAR | Status: AC
Start: 2023-09-18 — End: ?
  Filled 2023-09-18: qty 2

## 2023-09-18 MED ORDER — FENTANYL CITRATE (PF) 100 MCG/2ML IJ SOLN
INTRAMUSCULAR | Status: DC | PRN
  Administered 2023-09-18: 50 ug via INTRAVENOUS

## 2023-09-18 MED ORDER — MIDAZOLAM HCL 2 MG/2ML IJ SOLN
INTRAMUSCULAR | Status: AC
Start: 2023-09-18 — End: ?
  Filled 2023-09-18: qty 2

## 2023-09-18 MED ORDER — SIGHTPATH DOSE#1 BSS IO SOLN
INTRAOCULAR | Status: DC | PRN
  Administered 2023-09-18: 15 mL via INTRAOCULAR

## 2023-09-18 SURGICAL SUPPLY — 12 items
CATARACT SUITE SIGHTPATH (MISCELLANEOUS) ×1 IMPLANT
CYSTOTOME ANG REV CUT SHRT 25G (CUTTER) ×1 IMPLANT
CYSTOTOME ANGL RVRS SHRT 25G (CUTTER) ×1 IMPLANT
FEE CATARACT SUITE SIGHTPATH (MISCELLANEOUS) ×1 IMPLANT
GLOVE BIOGEL PI IND STRL 8 (GLOVE) ×1 IMPLANT
GLOVE SURG LX STRL 8.0 MICRO (GLOVE) ×1 IMPLANT
GLOVE SURG PROTEXIS BL SZ6.5 (GLOVE) ×1 IMPLANT
GLOVE SURG SYN 6.5 PF PI BL (GLOVE) ×1 IMPLANT
LENS IOL TECNIS EYHANCE 16.5 (Intraocular Lens) IMPLANT
NDL FILTER BLUNT 18X1 1/2 (NEEDLE) ×1 IMPLANT
NEEDLE FILTER BLUNT 18X1 1/2 (NEEDLE) ×1 IMPLANT
SYR 3ML LL SCALE MARK (SYRINGE) ×1 IMPLANT

## 2023-09-18 NOTE — H&P (Signed)
 [facility]   Primary Care Physician:  Doreene Nest, NP Ophthalmologist: Dr. Druscilla Brownie  Pre-Procedure History & Physical: HPI:  Natasha George is a 68 y.o. female here for cataract surgery.   Past Medical History:  Diagnosis Date   Chickenpox    Multiple sclerosis (HCC)    Osteopenia    right hip   Osteoporosis    left hip   Rosacea    Seizure (HCC)    12 yrs ago, on keppra    Past Surgical History:  Procedure Laterality Date   CATARACT EXTRACTION W/PHACO Right 09/04/2023   Procedure: CATARACT EXTRACTION PHACO AND INTRAOCULAR LENS PLACEMENT (IOC) RIGHT 6.89 00:35.0;  Surgeon: Galen Manila, MD;  Location: [facility];  Service: Ophthalmology;  Laterality: Right;   COLONOSCOPY     COLONOSCOPY WITH PROPOFOL N/A 05/27/2020   Procedure: COLONOSCOPY WITH PROPOFOL;  Surgeon: Wyline Mood, MD;  Location: [facility] ENDOSCOPY;  Service: Gastroenterology;  Laterality: N/A;   COLONOSCOPY WITH PROPOFOL N/A 06/04/2023   Procedure: COLONOSCOPY WITH PROPOFOL;  Surgeon: Toney Reil, MD;  Location: [facility] ENDOSCOPY;  Service: Gastroenterology;  Laterality: N/A;   GASTROCNEMIUS RECESSION Left 12/24/2019    Prior to Admission medications   Medication Sig Start Date End Date Taking? Authorizing Provider  Cholecalciferol 125 MCG (5000 UT) TABS cholecalciferol (vitamin D3) 125 mcg (5,000 unit) tablet 07/17/1968   [provider]  Clocortolone Pivalate (CLODERM) 0.1 % cream Apply topically as needed. 05/02/22   [provider]  diclofenac (VOLTAREN) 75 MG EC tablet Take 1 tablet (75 mg total) by mouth 2 (two) times daily. Patient not taking: Reported on 08/22/2023 04/13/23   Excell Seltzer, MD  hydrocortisone 2.5 % ointment as needed. 08/09/22   [provider]  levETIRAcetam (KEPPRA) 500 MG tablet Taking 500 mg by mouth in the morning and 1000 mg in the evening 01/17/18   [provider]  Multiple Vitamins-Minerals (MULTIVITAMIN WOMEN PO)  Take by mouth.    [provider]  sulfamethoxazole-trimethoprim (BACTRIM DS) 800-160 MG tablet Take 1 tablet by mouth 2 (two) times daily. For urinary tract infection. Patient not taking: Reported on 08/22/2023 07/20/23   Doreene Nest, NP  VUMERITY 231 MG CPDR Take by mouth. Taking 2 tablets by mouth in the am and 2 tablets in the pm    [provider]    Allergies as of 07/02/2023 - Review Complete 06/04/2023  Allergen Reaction Noted   Doxycycline Rash 08/09/2021   Metronidazole Itching 08/09/2021    Family History  Adopted: Yes  Problem Relation Age of Onset   Breast cancer Neg Hx     Social History   Socioeconomic History   Marital status: Married    Spouse name: Not on file   Number of children: Not on file   Years of education: Not on file   Highest education level: Not on file  Occupational History   Not on file  Tobacco Use   Smoking status: Never   Smokeless tobacco: Never  Vaping Use   Vaping status: Never Used  Substance and Sexual Activity   Alcohol use: Yes    Alcohol/week: 4.0 standard drinks of alcohol    Types: 4 Glasses of wine per week    Comment: socially   Drug use: Never   Sexual activity: Yes    Partners: Male    Comment: Married  Other Topics Concern   Not on file  Social History Narrative   Not on file  Social Drivers of Corporate investment banker Strain: Not on file  Food Insecurity: Not on file  Transportation Needs: Not on file  Physical Activity: Not on file  Stress: Not on file  Social Connections: Not on file  Intimate Partner Violence: Not on file    Review of Systems: See HPI, otherwise negative ROS  Physical Exam: BP 127/69   Pulse 68   Temp (!) 97.3 F (36.3 C) (Temporal)   Resp 17   Ht 5\' 2"  (1.575 m)   Wt 59.9 kg   SpO2 100%   BMI 24.14 kg/m  General:   Alert, cooperative in NAD Head:  Normocephalic and atraumatic. Respiratory:  Normal work of breathing. Cardiovascular:   RRR  Impression/Plan: Natasha George is here for cataract surgery.  Risks, benefits, limitations, and alternatives regarding cataract surgery have been reviewed with the patient.  Questions have been answered.  All parties agreeable.   Galen Manila, MD  09/18/2023, 8:05 AM

## 2023-09-18 NOTE — Transfer of Care (Signed)
 Immediate Anesthesia Transfer of Care Note  Patient: Natasha George  Procedure(s) Performed: CATARACT EXTRACTION PHACO AND INTRAOCULAR LENS PLACEMENT (IOC) LEFT 5.62 00:38.4 (Left: Eye)  Patient Location: PACU  Anesthesia Type: MAC  Level of Consciousness: awake, alert  and patient cooperative  Airway and Oxygen Therapy: Patient Spontanous Breathing and Patient connected to supplemental oxygen  Post-op Assessment: Post-op Vital signs reviewed, Patient's Cardiovascular Status Stable, Respiratory Function Stable, Patent Airway and No signs of Nausea or vomiting  Post-op Vital Signs: Reviewed and stable  Complications: No notable events documented.

## 2023-09-18 NOTE — Op Note (Signed)
 PREOPERATIVE DIAGNOSIS:  Nuclear sclerotic cataract of the left eye.   POSTOPERATIVE DIAGNOSIS:  Nuclear sclerotic cataract of the left eye.   OPERATIVE PROCEDURE:ORPROCALL@   SURGEON:  Galen Manila, MD.   ANESTHESIA:  Anesthesiologist: Marisue Humble, MD CRNA: Barbette Hair, CRNA  1.      Managed anesthesia care. 2.     0.77ml of Shugarcaine was instilled following the paracentesis   COMPLICATIONS:  None.   TECHNIQUE:   Stop and chop   DESCRIPTION OF PROCEDURE:  The patient was examined and consented in the preoperative holding area where the aforementioned topical anesthesia was applied to the left eye and then brought back to the Operating Room where the left eye was prepped and draped in the usual sterile ophthalmic fashion and a lid speculum was placed. A paracentesis was created with the side port blade and the anterior chamber was filled with viscoelastic. A near clear corneal incision was performed with the steel keratome. A continuous curvilinear capsulorrhexis was performed with a cystotome followed by the capsulorrhexis forceps. Hydrodissection and hydrodelineation were carried out with BSS on a blunt cannula. The lens was removed in a stop and chop  technique and the remaining cortical material was removed with the irrigation-aspiration handpiece. The capsular bag was inflated with viscoelastic and the Technis [id number] lens was placed in the capsular bag without complication. The remaining viscoelastic was removed from the eye with the irrigation-aspiration handpiece. The wounds were hydrated. The anterior chamber was flushed with BSS and the eye was inflated to physiologic pressure. 0.48ml Vigamox was placed in the anterior chamber. The wounds were found to be water tight. The eye was dressed with Combigan. The patient was given protective glasses to wear throughout the day and a shield with which to sleep tonight. The patient was also given drops with which to begin a drop regimen  today and will follow-up with me in one day. Implant Name Type Inv. Item Serial No. Manufacturer Lot No. LRB No. Used Action  LENS IOL TECNIS EYHANCE 16.5 - [id number] Intraocular Lens LENS IOL TECNIS EYHANCE 16.5 [id number] [employer]  Left 1 Implanted    Procedure(s): CATARACT EXTRACTION PHACO AND INTRAOCULAR LENS PLACEMENT (IOC) LEFT 5.62 00:38.4 (Left)  Electronically signed: Galen Manila 09/18/2023 8:24 AM

## 2023-09-18 NOTE — Anesthesia Postprocedure Evaluation (Signed)
 Anesthesia Post Note  Patient: Nona A Ose  Procedure(s) Performed: CATARACT EXTRACTION PHACO AND INTRAOCULAR LENS PLACEMENT (IOC) LEFT 5.62 00:38.4 (Left: Eye)  Patient location during evaluation: PACU Anesthesia Type: MAC Level of consciousness: awake and alert Pain management: pain level controlled Vital Signs Assessment: post-procedure vital signs reviewed and stable Respiratory status: spontaneous breathing, nonlabored ventilation, respiratory function stable and patient connected to nasal cannula oxygen Cardiovascular status: stable and blood pressure returned to baseline Postop Assessment: no apparent nausea or vomiting Anesthetic complications: no   No notable events documented.   Last Vitals:  Vitals:   09/18/23 0824 09/18/23 0829  BP: 123/73 114/77  Pulse: 70 66  Resp: 15 16  Temp: (!) 36.3 C (!) 36.3 C  SpO2: 99% 98%    Last Pain:  Vitals:   09/18/23 0829  TempSrc:   PainSc: 0-No pain                 Bevely Palmer Maki Sweetser

## 2023-09-19 ENCOUNTER — Encounter: Payer: Self-pay | Admitting: Ophthalmology

## 2023-10-12 ENCOUNTER — Ambulatory Visit: Payer: BC Managed Care – PPO | Admitting: Primary Care

## 2023-10-12 ENCOUNTER — Encounter: Payer: Self-pay | Admitting: Primary Care

## 2023-10-12 VITALS — BP 132/76 | HR 75 | Temp 97.3°F | Ht 62.0 in | Wt 132.0 lb

## 2023-10-12 DIAGNOSIS — Z0000 Encounter for general adult medical examination without abnormal findings: Secondary | ICD-10-CM

## 2023-10-12 DIAGNOSIS — G35 Multiple sclerosis: Secondary | ICD-10-CM | POA: Diagnosis not present

## 2023-10-12 DIAGNOSIS — G40909 Epilepsy, unspecified, not intractable, without status epilepticus: Secondary | ICD-10-CM

## 2023-10-12 DIAGNOSIS — R21 Rash and other nonspecific skin eruption: Secondary | ICD-10-CM | POA: Diagnosis not present

## 2023-10-12 DIAGNOSIS — Z1231 Encounter for screening mammogram for malignant neoplasm of breast: Secondary | ICD-10-CM

## 2023-10-12 DIAGNOSIS — E785 Hyperlipidemia, unspecified: Secondary | ICD-10-CM

## 2023-10-12 DIAGNOSIS — Z23 Encounter for immunization: Secondary | ICD-10-CM | POA: Diagnosis not present

## 2023-10-12 DIAGNOSIS — E559 Vitamin D deficiency, unspecified: Secondary | ICD-10-CM

## 2023-10-12 DIAGNOSIS — M8080XS Other osteoporosis with current pathological fracture, unspecified site, sequela: Secondary | ICD-10-CM

## 2023-10-12 LAB — COMPREHENSIVE METABOLIC PANEL WITH GFR
ALT: 18 U/L (ref 0–35)
AST: 18 U/L (ref 0–37)
Albumin: 4.7 g/dL (ref 3.5–5.2)
Alkaline Phosphatase: 60 U/L (ref 39–117)
BUN: 15 mg/dL (ref 6–23)
CO2: 30 meq/L (ref 19–32)
Calcium: 9.6 mg/dL (ref 8.4–10.5)
Chloride: 104 meq/L (ref 96–112)
Creatinine, Ser: 0.52 mg/dL (ref 0.40–1.20)
GFR: 97.3 mL/min (ref >60.00)
Glucose, Bld: 91 mg/dL (ref 70–99)
Potassium: 5.1 meq/L (ref 3.5–5.1)
Sodium: 141 meq/L (ref 135–145)
Total Bilirubin: 0.5 mg/dL (ref 0.2–1.2)
Total Protein: 6.8 g/dL (ref 6.0–8.3)

## 2023-10-12 LAB — LIPID PANEL
Cholesterol: 222 mg/dL — ABNORMAL HIGH (ref 0–200)
HDL: 91.3 mg/dL (ref >39.00)
LDL Cholesterol: 123 mg/dL — ABNORMAL HIGH (ref 0–99)
NonHDL: 130.62
Total CHOL/HDL Ratio: 2
Triglycerides: 39 mg/dL (ref 0.0–149.0)
VLDL: 7.8 mg/dL (ref 0.0–40.0)

## 2023-10-12 NOTE — Assessment & Plan Note (Signed)
 Following with [facility]. Continue Evenity 105 mg once monthly.

## 2023-10-12 NOTE — Assessment & Plan Note (Signed)
 Repeat lipid panel pending.

## 2023-10-12 NOTE — Assessment & Plan Note (Signed)
 Stable.  Following with neurology in [place]. Reviewed office notes from March 2025 through [facility]. Continue Vumerity 231 mg, 2 tabs in AM and 2 tabs in PM.

## 2023-10-12 NOTE — Assessment & Plan Note (Signed)
 Continue vitamin D supplements. Following with neurology and ortho.

## 2023-10-12 NOTE — Patient Instructions (Signed)
 Stop by the lab prior to leaving today. I will notify you of your results once received.   [facility] your [facility].   It was a pleasure to see you today!

## 2023-10-12 NOTE — Assessment & Plan Note (Signed)
 No recent seizures. Following with neurology.   Continue Keppra 500 mg in AM and 1000 mg in PM.

## 2023-10-12 NOTE — Assessment & Plan Note (Addendum)
 Prevnar 20 provided today. Mammogram due, orders placed. Bone density scan UTD Colonoscopy UTD, due 2029  Advanced directives packet provided.  Discussed the importance of a healthy diet and regular exercise in order for weight loss, and to reduce the risk of further co-morbidity.  Exam stable. Labs pending.  Follow up in 1 year for repeat physical.

## 2023-10-12 NOTE — Progress Notes (Signed)
 Subjective:    Natasha George is a 68 y.o. female who presents for a Welcome to Medicare exam.   Review of Systems  Constitutional:  Negative for chills and fever.  HENT:  Negative for sore throat.   Eyes:  Negative for blurred vision.  Respiratory:  Negative for shortness of breath.   Cardiovascular:  Negative for chest pain.  Gastrointestinal:  Negative for constipation and diarrhea.  Genitourinary:  Negative for frequency.  Musculoskeletal:  Positive for joint pain.  Skin:  Negative for rash.  Neurological:  Negative for dizziness.  Endo/Heme/Allergies:  Negative for environmental allergies.  Psychiatric/Behavioral:  Negative for depression.            Immunizations: -Tetanus: Completed in 2024 -Shingles: Never completed Shingrix series -Pneumonia: Never completed   Mammogram: Completed in May 2024  Colonoscopy: Completed in 2024, due 2029 Dexa: Completed in December 2024     Objective:    Today's Vitals   10/12/23 0821  BP: 132/76  Pulse: 75  Temp: (!) 97.3 F (36.3 C)  TempSrc: Temporal  SpO2: 100%  Weight: 132 lb (59.9 kg)  Height: 5\' 2"  (1.575 m)  Body mass index is 24.14 kg/m.  Medications Outpatient Encounter Medications as of 10/12/2023  Medication Sig   Cholecalciferol 125 MCG (5000 UT) TABS cholecalciferol (vitamin D3) 125 mcg (5,000 unit) tablet   Clocortolone Pivalate (CLODERM) 0.1 % cream Apply topically as needed.   hydrocortisone 2.5 % ointment as needed.   levETIRAcetam (KEPPRA) 500 MG tablet Taking 500 mg by mouth in the morning and 1000 mg in the evening   Multiple Vitamins-Minerals (MULTIVITAMIN WOMEN PO) Take by mouth.   Romosozumab-aqqg (EVENITY) 105 MG/1. SOSY injection Inject 210 mg into the skin every 30 (thirty) days.   VUMERITY 231 MG CPDR Take by mouth. Taking 2 tablets by mouth in the am and 2 tablets in the pm   [DISCONTINUED] diclofenac (VOLTAREN) 75 MG EC tablet Take 1 tablet (75 mg total) by mouth 2 (two) times  daily. (Patient not taking: Reported on 10/12/2023)   [DISCONTINUED] sulfamethoxazole-trimethoprim (BACTRIM DS) 800-160 MG tablet Take 1 tablet by mouth 2 (two) times daily. For urinary tract infection. (Patient not taking: Reported on 10/12/2023)   No facility-administered encounter medications on file as of 10/12/2023.     History: Past Medical History:  Diagnosis Date   Acute pain of left knee 04/13/2023   Chickenpox    Localized swelling of left lower leg 04/13/2023   Multiple sclerosis (HCC)    Osteopenia    right hip   Osteoporosis    left hip   Rosacea    Seizure (HCC)    12 yrs ago, on keppra   Swelling of hand joint, right 08/05/2021   Past Surgical History:  Procedure Laterality Date   CATARACT EXTRACTION W/PHACO Right 09/04/2023   Procedure: CATARACT EXTRACTION PHACO AND INTRAOCULAR LENS PLACEMENT (IOC) RIGHT 6.89 00:35.0;  Surgeon: Galen Manila, MD;  Location: [facility];  Service: Ophthalmology;  Laterality: Right;   CATARACT EXTRACTION W/PHACO Left 09/18/2023   Procedure: CATARACT EXTRACTION PHACO AND INTRAOCULAR LENS PLACEMENT (IOC) LEFT 5.62 00:38.4;  Surgeon: Galen Manila, MD;  Location: [facility];  Service: Ophthalmology;  Laterality: Left;   COLONOSCOPY     COLONOSCOPY WITH PROPOFOL N/A 05/27/2020   Procedure: COLONOSCOPY WITH PROPOFOL;  Surgeon: Wyline Mood, MD;  Location: [facility];  Service: Gastroenterology;  Laterality: N/A;   COLONOSCOPY WITH PROPOFOL N/A 06/04/2023   Procedure: COLONOSCOPY WITH PROPOFOL;  Surgeon: Toney Reil, MD;  Location: [facility] ENDOSCOPY;  Service: Gastroenterology;  Laterality: N/A;   GASTROCNEMIUS RECESSION Left 12/24/2019    Family History  Adopted: Yes  Problem Relation Age of Onset   Breast cancer Neg Hx    Social History   Occupational History   Not on file  Tobacco Use   Smoking status: Never   Smokeless tobacco: Never  Vaping Use   Vaping status: Never Used  Substance and Sexual  Activity   Alcohol use: Yes    Alcohol/week: 4.0 standard drinks of alcohol    Types: 4 Glasses of wine per week    Comment: socially   Drug use: Never   Sexual activity: Yes    Partners: Male    Comment: Married    Tobacco Counseling Counseling given: Not Answered   Immunizations and Health Maintenance Immunization History  Administered Date(s) Administered   Influenza,inj,Quad PF,6+ Mos 05/01/2018, 05/06/2019, 05/06/2020, 05/20/2021   Influenza-Unspecified 06/18/2013, 05/12/2016   PFIZER(Purple Top)SARS-COV-2 Vaccination 10/10/2019, 11/04/2019, 06/18/2020   PNEUMOCOCCAL CONJUGATE-20 10/12/2023   Tdap 09/21/2010, 10/10/2022   Health Maintenance Due  Topic Date Due   Zoster Vaccines- Shingrix (1 of 2) Never done    Activities of Daily Living    10/12/2023    8:19 AM 09/18/2023    7:38 AM  In your present state of health, do you have any difficulty performing the following activities:  Hearing? 0 0  Vision? 0 0  Difficulty concentrating or making decisions? 0 0  Walking or climbing stairs? 0   Dressing or bathing? 0   Doing errands, shopping? 0     Physical Exam HENT:     Right Ear: Tympanic membrane and ear canal normal.     Left Ear: Tympanic membrane and ear canal normal.  Eyes:     Pupils: Pupils are equal, round, and reactive to light.  Cardiovascular:     Rate and Rhythm: Normal rate and regular rhythm.  Pulmonary:     Effort: Pulmonary effort is normal.     Breath sounds: Normal breath sounds.  Abdominal:     General: Bowel sounds are normal.     Palpations: Abdomen is soft.     Tenderness: There is no abdominal tenderness.  Musculoskeletal:        General: Normal range of motion.     Cervical back: Neck supple.  Skin:    General: Skin is warm and dry.  Neurological:     Mental Status: She is alert and oriented to person, place, and time.     Cranial Nerves: No cranial nerve deficit.     Deep Tendon Reflexes:     Reflex Scores:      Patellar  reflexes are 2+ on the right side and 2+ on the left side. Psychiatric:        Mood and Affect: Mood normal.     Advanced Directives: Has not completed.        ECG: NSR, rate of 76, no PAC/PVC, no acute ST changes. No old ECG to compare. Assessment:    This is a routine wellness examination for this patient .   Vision/Hearing screen Hearing Screening   500Hz  1000Hz  2000Hz  5000Hz   Right ear 25 40 20 40  Left ear 40 40 40 40   Vision Screening   Right eye Left eye Both eyes  Without correction 20/15 20/15 20/15   With correction       Dietary issues and exercise activities discussed:  Goals   None    Depression Screen    10/12/2023    8:19 AM 04/13/2023    4:33 PM 10/10/2022    9:40 AM 08/05/2021    2:01 PM  PHQ 2/9 Scores  PHQ - 2 Score 0 0 0 0  PHQ- 9 Score  0       Fall Risk    10/12/2023    8:19 AM  Fall Risk   Falls in the past year? 0  Number falls in past yr: 0  Injury with Fall? 0  Risk for fall due to : No Fall Risks  Follow up Falls evaluation completed    Cognitive Function:        10/12/2023    8:20 AM  6CIT Screen  What Year? 0 points  What month? 0 points  What time? 0 points  Count back from 20 0 points  Months in reverse 0 points  Repeat phrase 0 points  Total Score 0 points    Patient Care Team: Doreene Nest, NP as PCP - General (Internal Medicine) Winfred Leeds Basilia Jumbo, MD as Referring Physician (Orthopedic Surgery) Mal Amabile as Consulting Physician (Neurology) Winfred Leeds Basilia Jumbo, MD as Referring Physician (Orthopedic Surgery)     Plan:   See problem based charting.  I have personally reviewed and noted the following in the patient's chart:   Medical and social history Use of alcohol, tobacco or illicit drugs  Current medications and supplements Functional ability and status Nutritional status Physical activity Advanced directives List of other physicians Hospitalizations, surgeries, and ER visits in  previous 12 months Vitals Screenings to include cognitive, depression, and falls Referrals and appointments  In addition, I have reviewed and discussed with patient certain preventive protocols, quality metrics, and best practice recommendations. A written personalized care plan for preventive services as well as general preventive health recommendations were provided to patient.     Doreene Nest, NP 10/12/2023

## 2023-10-12 NOTE — Assessment & Plan Note (Signed)
 Controlled. Following with dermatology.  Continue Cloderm 0.1% PRN.

## 2023-11-21 ENCOUNTER — Encounter (HOSPITAL_COMMUNITY): Payer: Self-pay

## 2023-11-28 ENCOUNTER — Ambulatory Visit
Admission: RE | Admit: 2023-11-28 | Discharge: 2023-11-28 | Disposition: A | Discharge status: Home / Self Care | Source: Ambulatory Visit | Attending: Primary Care | Admitting: Primary Care

## 2023-11-28 DIAGNOSIS — Z1231 Encounter for screening mammogram for malignant neoplasm of breast: Secondary | ICD-10-CM | POA: Diagnosis present

## 2023-12-04 ENCOUNTER — Ambulatory Visit: Payer: Self-pay | Admitting: Primary Care

## 2023-12-19 ENCOUNTER — Ambulatory Visit (INDEPENDENT_AMBULATORY_CARE_PROVIDER_SITE_OTHER): Admitting: Primary Care

## 2023-12-19 ENCOUNTER — Encounter: Payer: Self-pay | Admitting: Primary Care

## 2023-12-19 VITALS — BP 128/74 | HR 83 | Temp 97.2°F | Ht 62.0 in | Wt 132.0 lb

## 2023-12-19 DIAGNOSIS — R768 Other specified abnormal immunological findings in serum: Secondary | ICD-10-CM

## 2023-12-19 DIAGNOSIS — R21 Rash and other nonspecific skin eruption: Secondary | ICD-10-CM | POA: Diagnosis not present

## 2023-12-19 MED ORDER — KETOCONAZOLE 2 % EX CREA: 1.0000 | TOPICAL_CREAM | Freq: Two times a day (BID) | CUTANEOUS | 0 refills | Status: DC

## 2023-12-19 NOTE — Progress Notes (Addendum)
 Subjective:    Patient ID: Natasha George, female    DOB: April 12, 1956, 68 y.o.   MRN: [id number]  Rash Pertinent negatives include no shortness of breath.    Natasha George is a very pleasant 68 y.o. female  with a history of multiple sclerosis on immunosuppression treatment, seizure disorder, osteoporosis, who presents today to discuss rash.  Chronic history.  Following with dermatology, Dr. Euel Herring.  Completed patch testing in early 2024 which was negative. Last office visit with Dr. Euel Herring was in April 2024.  She was advised to continue Cutivate cream twice daily as needed, start methotrexate 7.5 mg weekly and folic acid 1 mg daily as well as clocortoline pivaite 0.1% PRN.   Today she discusses a 3 week history of her chronic facial rash flare. Her rash has been erythematous with pimples which is characteristic. She denies changes in anything including medications, foods, soaps/detergent. She's been on [employer] and believes she has perioral dermatitis. Her rash extends to the bilateral cheeks and nose, chin and periorally. She's been using the prescribed topical steroid cream without improvement.   Her flares have been chronic for years, waxes and wanes.  She feels like she has a rash most every day just somedays worse than other days.  Labs drawn in January 2023 including RF, ANA.  ANA titer came back positive at 1:40 cytoplasmic and 1:1280 speckled. Not evaluated by rheumatology.   Review of Systems  Respiratory:  Negative for shortness of breath.   Skin:  Positive for rash.         Past Medical History:  Diagnosis Date   Acute pain of left knee 04/13/2023   Chickenpox    Localized swelling of left lower leg 04/13/2023   Multiple sclerosis (HCC)    Osteopenia    right hip   Osteoporosis    left hip   Rosacea    Seizure (HCC)    12 yrs ago, on keppra   Swelling of hand joint, right 08/05/2021    Social History   Socioeconomic History   Marital status: Married     Spouse name: Not on file   Number of children: Not on file   Years of education: Not on file   Highest education level: Not on file  Occupational History   Not on file  Tobacco Use   Smoking status: Never   Smokeless tobacco: Never  Vaping Use   Vaping status: Never Used  Substance and Sexual Activity   Alcohol use: Yes    Alcohol/week: 4.0 standard drinks of alcohol    Types: 4 Glasses of wine per week    Comment: socially   Drug use: Never   Sexual activity: Yes    Partners: Male    Comment: Married  Other Topics Concern   Not on file  Social History Narrative   Not on file   Social Drivers of Health   Financial Resource Strain: Not on file  Food Insecurity: Not on file  Transportation Needs: Not on file  Physical Activity: Not on file  Stress: Not on file  Social Connections: Not on file  Intimate Partner Violence: Not on file    Past Surgical History:  Procedure Laterality Date   CATARACT EXTRACTION W/PHACO Right 09/04/2023   Procedure: CATARACT EXTRACTION PHACO AND INTRAOCULAR LENS PLACEMENT (IOC) RIGHT 6.89 00:35.0;  Surgeon: Clair Crews, MD;  Location: [facility];  Service: Ophthalmology;  Laterality: Right;   CATARACT EXTRACTION W/PHACO Left 09/18/2023   Procedure: CATARACT  EXTRACTION PHACO AND INTRAOCULAR LENS PLACEMENT (IOC) LEFT 5.62 00:38.4;  Surgeon: Clair Crews, MD;  Location: [facility];  Service: Ophthalmology;  Laterality: Left;   COLONOSCOPY     COLONOSCOPY WITH PROPOFOL  N/A 05/27/2020   Procedure: COLONOSCOPY WITH PROPOFOL ;  Surgeon: Luke Salaam, MD;  Location[facility];  Service: Gastroenterology;  Laterality: N/A;   COLONOSCOPY WITH PROPOFOL  N/A 06/04/2023   Procedure: COLONOSCOPY WITH PROPOFOL ;  Surgeon: Selena Daily, MD;  Location: [facility] ENDOS[facility];  Service: Gastroenterology;  Laterality: N/A;   GASTROCNEMIUS RECESSION Left 12/24/2019    Family History  Adopted: Yes  Problem Relation Age of Onset    Breast cancer Neg Hx     Allergies  Allergen Reactions   Doxycycline  Rash    Lip swelling/burning sensation No sob  Has taken with no issues   Metronidazole  Itching    Current Outpatient Medications on File Prior to Visit  Medication Sig Dispense Refill   Cholecalciferol 125 MCG (5000 UT) TABS cholecalciferol (vitamin D3) 125 mcg (5,000 unit) tablet     levETIRAcetam (KEPPRA) 500 MG tablet Taking 500 mg by mouth in the morning and 1000 mg in the evening     Multiple Vitamins-Minerals (MULTIVITAMIN WOMEN PO) Take by mouth.     Romosozumab-aqqg (EVENITY) 105 MG/1. SOSY injection Inject 210 mg into the skin every 30 (thirty) days.     VUMERITY 231 MG CPDR Take by mouth. Taking 2 tablets by mouth in the am and 2 tablets in the pm     Clocortolone Pivalate (CLODERM) 0.1 % cream Apply topically as needed. (Patient not taking: Reported on 12/19/2023)     hydrocortisone 2.5 % ointment as needed. (Patient not taking: Reported on 12/19/2023)     No current facility-administered medications on file prior to visit.    BP 128/74   Pulse 83   Temp (!) 97.2 F (36.2 C) (Temporal)   Ht 5\' 2"  (1.575 m)   Wt 132 lb (59.9 kg)   SpO2 97%   BMI 24.14 kg/m  Objective:   Physical Exam Cardiovascular:     Rate and Rhythm: Normal rate.  Pulmonary:     Effort: Pulmonary effort is normal.  Skin:    General: Skin is warm and dry.     Findings: Erythema present.     Comments: Erythematous, bupmy rash periorally to nose, cheeks and chin.   Neurological:     Mental Status: She is alert.             Assessment & Plan:  Facial rash Assessment & Plan: Unclear etiology. Parts of her rash resemble rosacea while others could represent seborrheic dermatitis vs lupus.  Given lack of improvement on moderate potency topical steroid we will switch treatment. Start ketoconazole 2% cream twice daily x 4 weeks.  If no improvement after 1 to 2 weeks then discontinue.  At that point would recommend  metronidazole  gel.  New referral placed to dermatology for reevaluation.  Labs from 2023 do show positive ANA with titer of 1:1280 speckled.  Will follow-up with repeat ANA and additional lupus labs.  Will refer to rheumatology if warranted.  Orders: -     Ketoconazole; Apply 1 Application topically 2 (two) times daily.  Dispense: 30 g; Refill: 0 -     Ambulatory referral to Dermatology -     ANA; Future -     Anti-DNA antibody, double-stranded; Future -     Anti-Smith antibody; Future -     Sedimentation rate; Future  Positive ANA (antinuclear antibody) -     ANA; Future -     Anti-DNA antibody, double-stranded; Future -     Anti-Smith antibody; Future -     Sedimentation rate; Future        Uchechukwu Dhawan K Ahmyah Gidley, NP

## 2023-12-19 NOTE — Addendum Note (Signed)
 Addended by: Caelie Remsburg K on: 12/19/2023 04:12 PM   Modules accepted: Orders

## 2023-12-19 NOTE — Patient Instructions (Signed)
 Apply the ketoconazole cream twice daily to your face x 4 weeks.  Please update me in 1 week.  You will either be contacted via phone regarding your referral to dermatology, or you may receive a letter on your MyChart portal from our referral team with instructions for scheduling an appointment. Please let us  know if you have not been contacted by anyone within two weeks.  It was a pleasure to see you today!

## 2023-12-19 NOTE — Assessment & Plan Note (Addendum)
 Unclear etiology. Parts of her rash resemble rosacea while others could represent seborrheic dermatitis vs lupus.  Given lack of improvement on moderate potency topical steroid we will switch treatment. Start ketoconazole 2% cream twice daily x 4 weeks.  If no improvement after 1 to 2 weeks then discontinue.  At that point would recommend metronidazole  gel.  New referral placed to dermatology for reevaluation.  Labs from 2023 do show positive ANA with titer of 1:1280 speckled.  Will follow-up with repeat ANA and additional lupus labs.  Will refer to rheumatology if warranted.

## 2023-12-21 ENCOUNTER — Other Ambulatory Visit (INDEPENDENT_AMBULATORY_CARE_PROVIDER_SITE_OTHER)

## 2023-12-21 DIAGNOSIS — R768 Other specified abnormal immunological findings in serum: Secondary | ICD-10-CM | POA: Diagnosis not present

## 2023-12-21 DIAGNOSIS — R21 Rash and other nonspecific skin eruption: Secondary | ICD-10-CM | POA: Diagnosis not present

## 2023-12-21 LAB — SEDIMENTATION RATE: Sed Rate: 3 mm/h (ref 0–30)

## 2023-12-23 LAB — ANTI-NUCLEAR AB-TITER (ANA TITER): ANA Titer 1: 1:320 {titer} — ABNORMAL HIGH

## 2023-12-23 LAB — ANTI-SMITH ANTIBODY: ENA SM Ab Ser-aCnc: <1 AI

## 2023-12-23 LAB — ANTI-DNA ANTIBODY, DOUBLE-STRANDED: ds DNA Ab: <1 [IU]/mL

## 2023-12-23 LAB — ANA: Anti Nuclear Antibody (ANA): POSITIVE — AB

## 2023-12-24 ENCOUNTER — Ambulatory Visit: Payer: Self-pay | Admitting: Primary Care

## 2023-12-24 DIAGNOSIS — R768 Other specified abnormal immunological findings in serum: Secondary | ICD-10-CM

## 2023-12-24 DIAGNOSIS — R21 Rash and other nonspecific skin eruption: Secondary | ICD-10-CM

## 2024-02-13 ENCOUNTER — Encounter: Payer: Self-pay | Admitting: Physician Assistant

## 2024-02-13 ENCOUNTER — Ambulatory Visit: Admitting: Physician Assistant

## 2024-02-13 DIAGNOSIS — I781 Nevus, non-neoplastic: Secondary | ICD-10-CM

## 2024-02-13 DIAGNOSIS — L719 Rosacea, unspecified: Secondary | ICD-10-CM

## 2024-02-13 DIAGNOSIS — L710 Perioral dermatitis: Secondary | ICD-10-CM

## 2024-02-13 DIAGNOSIS — L249 Irritant contact dermatitis, unspecified cause: Secondary | ICD-10-CM | POA: Diagnosis not present

## 2024-02-13 MED ORDER — DOXYCYCLINE HYCLATE 100 MG PO CAPS: 100.0000 mg | ORAL_CAPSULE | Freq: Every day | ORAL | 0 refills | Status: DC

## 2024-02-13 MED ORDER — DOXYCYCLINE HYCLATE 100 MG PO CAPS: ORAL_CAPSULE | ORAL | 1 refills | Status: DC

## 2024-02-13 NOTE — Progress Notes (Signed)
   New Patient Visit   Subjective  Natasha George is a 68 y.o. female who presents for the following: facial rash.  Patient has had rash on her face since November since 2022. She went to her PCP, Comer Gaskins, NP, and was prescribed Triamcinolone , which was unsuccessful. She was sent to Niels Bless, MD and tried fluticasone, and metronidazole . An allergy test was performed on 08/22/2021 and came back negative for everything. She was then prescribed Clorcortolone pivalate. She was then referred to Dr. Reda who prescribed Ketoconazole . She then had a more intense allergy testing in September 25, 2022 that came back negative as well. She was then told that they were prescribed a medication that was used to treat cancer, and she would need bloodwork. The order was never sent to [facility] and the patient gave up after trying to get the order for 6 weeks.   Patient uses dove or Aquanil facial cleanser. She also uses Vanicream at times.   Patient reports that her symptoms improved when she went to [place] in the fall. She is very frustrated and wants to find out the reason she has this rash.   Accompanied by her husband today.    The following portions of the chart were reviewed this encounter and updated as appropriate: medications, allergies, medical history  Review of Systems:  No other skin or systemic complaints except as noted in HPI or Assessment and Plan.  Objective  Well appearing patient in no apparent distress; mood and affect are within normal limits.  A focused examination was performed of the following areas: Face, neck, and chest.   Relevant exam findings are noted in the Assessment and Plan.    Assessment & Plan   ROSACEA Exam Mid face erythema with telangiectasias   Mild.   Rosacea is a chronic progressive skin condition usually affecting the face of adults, causing redness and/or acne bumps. It is treatable but not curable. It sometimes affects the eyes (ocular  rosacea) as well. It may respond to topical and/or systemic medication and can flare with stress, sun exposure, alcohol, exercise, topical steroids (including hydrocortisone/cortisone 10) and some foods.  Daily application of broad spectrum spf 30+ sunscreen to face is recommended to reduce flares.    Treatment Plan: -Avoid spicy foods, red wine, heat, hot water  as these are all triggers of rosacea.  -Will treat current rash first which I believe is perioral dermatitis.  - Will discuss treatment, if desires at her follow up appt in 3 months.   PERIORAL DERMATITIS          Exam: Scaly pink papules coalescing to plaques around the nose and mouth  Treatment Plan: -Will treat with oral Doxycycline  100mg  to take once a day/one month. Take with food to avoid GI upset -Continue to use VaniCream gentle face wash  -STOP other face wash  IRRITANT CONTACT DERMATITIS Exam: Scaly pink papules and/or plaques  Treatment Plan: Discontinue current face wash and creams. Recommends switching to VaniCream products.   PERIORAL DERMATITIS   IRRITANT CONTACT DERMATITIS, UNSPECIFIED TRIGGER   ROSACEA    Return in about 3 months (around 05/15/2024) for dermatitis f/u.  I, Gordan Beams, CMA, am acting as scribe for Agripina Guyette K, PA-C.   Documentation: I have reviewed the above documentation for accuracy and completeness, and I agree with the above.  Mikia Delaluz K, PA-C

## 2024-05-07 ENCOUNTER — Ambulatory Visit: Admitting: Physician Assistant

## 2024-05-07 ENCOUNTER — Encounter: Payer: Self-pay | Admitting: Physician Assistant

## 2024-05-07 ENCOUNTER — Other Ambulatory Visit: Payer: Self-pay | Admitting: Physician Assistant

## 2024-05-07 VITALS — BP 134/83 | HR 74

## 2024-05-07 DIAGNOSIS — L720 Epidermal cyst: Secondary | ICD-10-CM | POA: Diagnosis not present

## 2024-05-07 DIAGNOSIS — L309 Dermatitis, unspecified: Secondary | ICD-10-CM | POA: Diagnosis not present

## 2024-05-07 DIAGNOSIS — L719 Rosacea, unspecified: Secondary | ICD-10-CM

## 2024-05-07 MED ORDER — IVERMECTIN 1 % EX CREA: TOPICAL_CREAM | CUTANEOUS | 2 refills | Status: DC

## 2024-05-07 MED ORDER — HYDROCORTISONE 2.5 % EX OINT: TOPICAL_OINTMENT | CUTANEOUS | 2 refills | Status: AC | PRN

## 2024-05-07 MED ORDER — DOXYCYCLINE HYCLATE 50 MG PO CAPS: 50.0000 mg | ORAL_CAPSULE | Freq: Every day | ORAL | 6 refills | Status: AC

## 2024-05-07 NOTE — Progress Notes (Signed)
   Follow-Up Visit   Subjective  Natasha George is a 68 y.o. female who presents for the following: FOLLOW UP of rosacea and dermatitis unspecified. See last encounter for full details.   Patient was last evaluated on 02/13/24. At this visit patient was prescribed doxy 100  mg once a day for one month. Patient reports sxs are better - in fact, per she and her husband her face was almost completely clear but after 10 days of finishing the medication the rash started coming back.   The following portions of the chart were reviewed this encounter and updated as appropriate: medications, allergies, medical history  Review of Systems:  No other skin or systemic complaints except as noted in HPI or Assessment and Plan.  Objective  Well appearing patient in no apparent distress; mood and affect are within normal limits.  A focused examination was performed of the following areas: Face  Relevant exam findings are noted in the Assessment and Plan.          Assessment & Plan  ROSACEA Exam Mid face erythema with telangiectasias - improved but inadequately controlled.   Rosacea is a chronic progressive skin condition usually affecting the face of adults, causing redness and/or acne bumps. It is treatable but not curable. It sometimes affects the eyes (ocular rosacea) as well. It may respond to topical and/or systemic medication and can flare with stress, sun exposure, alcohol, exercise, topical steroids (including hydrocortisone/cortisone 10) and some foods.  Daily application of broad spectrum spf 30+ sunscreen to face is recommended to reduce flares.   Treatment Plan - start Ivermectin every am  - start doxycycline  if Ivermectin not able to control    Dermatitis unspecified - FACE - NEARLY RESOLVED (likely allergic contact)   Exam: Minimal redness right lateral canthus of eye   Treatment Plan: - HC 2.5% as directed until resolved  - Continue Vanicream products    Milia --  RIGHT CHEEK  - tiny firm white papules - type of cyst - benign - sometimes these will clear with nightly OTC adapalene/Differin 0.1% gel or retinol. - may be extracted if symptomatic - observe   ROSACEA   Related Medications Ivermectin 1 % CREA APPLY ONCE DAILY TO FACE IN THE MORNING doxycycline  (VIBRAMYCIN ) 50 MG capsule Take 1 capsule (50 mg total) by mouth daily. DERMATITIS, UNSPECIFIED   Related Medications hydrocortisone 2.5 % ointment Apply topically as needed. MILIA    Return in about 6 months (around 11/05/2024) for Rosacea follow up.  I, Doyce Pan, CMA, am acting as scribe for Leonard Hendler K, PA-C.   Documentation: I have reviewed the above documentation for accuracy and completeness, and I agree with the above.  Beauden Tremont K, PA-C

## 2024-05-07 NOTE — Patient Instructions (Signed)

## 2024-05-11 ENCOUNTER — Encounter: Payer: Self-pay | Admitting: Physician Assistant

## 2024-05-11 MED ORDER — IVERMECTIN 1 % EX CREA
TOPICAL_CREAM | CUTANEOUS | 2 refills | Status: AC
Start: 2024-05-11 — End: ?

## 2024-06-16 NOTE — Progress Notes (Signed)
 Office Visit Note  Patient: Natasha George             Date of Birth: [birth year]           MRN: [id number]             PCP: Gretta Comer POUR, NP Referring: Gretta Comer POUR, NP Visit Date: 06/30/2024 Occupation: Data Unavailable  Subjective:  PIP and (Facial rash, elevated ANA)   History of Present Illness: Natasha George is a 68 y.o. female seen for the evaluation of positive ANA and rash.  According the patient her symptoms started about 2 years ago with facial rash.  She states she was initially seen by dermatologist at [facility].  She was given the diagnosis of atopic dermatitis and some topical agents were given which did not improve her rash.  She states the rash gradually got worse and she was [facility] where she was diagnosed with rosacea.  She was given oral doxycycline  which helped clear up the rash.  She has been using topical agents since then.  She has noticed improvement in the rash.  At the time she also had labs which showed positive ANA.  She was referred to me for the evaluation of positive ANA.  She gives history of dry eyes, photosensitivity and mild Raynaud's symptoms.  There is no history of oral ulcers, nasal ulcers, lymphadenopathy, inflammatory arthritis, hair loss.  She was diagnosed with MS in 1997.  She is under care of a neurologist since then.  She was diagnosed with osteoporosis after his left tibia stress fracture in 2024.  She was started on Evenity on since March 2025.  She takes calcium  and vitamin D on a regular basis.  She also goes to the gym and does aerobics stretching and lift weights.  She is right-handed, retired she used to work as a metallurgist.  She is accompanied by her husband Medford today.  She is gravida 2, para 2.  There is no history of preeclampsia or DVTs.  She drinks 4 glasses of wine a week.  She is non-smoker.  Family history is not known as she is adopted.    Activities of Daily Living:   Patient reports morning stiffness for 0 none.   Patient Denies nocturnal pain.  Difficulty dressing/grooming: Denies Difficulty climbing stairs: Denies Difficulty getting out of chair: Denies Difficulty using hands for taps, buttons, cutlery, and/or writing: Denies  Review of Systems  Constitutional:  Negative for fatigue, night sweats, weight gain and weight loss.  HENT:  Negative for mouth sores, trouble swallowing, trouble swallowing, mouth dryness and nose dryness.   Eyes:  Positive for dryness. Negative for pain, redness and visual disturbance.  Respiratory:  Negative for cough, shortness of breath and difficulty breathing.   Cardiovascular:  Negative for chest pain, palpitations, hypertension, irregular heartbeat and swelling in legs/feet.  Gastrointestinal:  Negative for blood in stool, constipation and diarrhea.  Endocrine: Negative for increased urination.  Genitourinary:  Negative for involuntary urination and vaginal dryness.  Musculoskeletal:  Negative for joint pain, gait problem, joint pain, joint swelling, myalgias, muscle weakness, morning stiffness, muscle tenderness and myalgias.  Skin:  Positive for color change and sensitivity to sunlight. Negative for rash, hair loss, skin tightness and ulcers.  Allergic/Immunologic: Negative for susceptible to infections.  Neurological:  Negative for dizziness, headaches, memory loss, night sweats and weakness.  Hematological:  Negative for swollen glands.  Psychiatric/Behavioral:  Negative for depressed mood and  sleep disturbance. The patient is not nervous/anxious.     PMFS History:  Patient Active Problem List   Diagnosis Date Noted   Welcome to Medicare preventive visit 10/12/2023   Osteoporosis 07/20/2023   History of colonic polyps 06/04/2023   Polyp of transverse colon 06/04/2023   Immunosuppression 09/20/2021   Polyarthralgia 08/05/2021   Facial rash 05/24/2021   Hyperkalemia 06/04/2020   Abnormal gait 03/25/2020    Muscle contracture 01/08/2020   Hyperlipidemia 05/06/2019   Elevated LFTs 05/06/2019   Lumbar radiculopathy 11/01/2018   Chronic left hip pain 05/01/2018   Preventative health care 05/01/2018   Vitamin D deficiency 04/05/2018   Multiple sclerosis 02/21/2018   Seizure disorder (HCC) 02/21/2018    Past Medical History:  Diagnosis Date   Acute pain of left knee 04/13/2023   Chickenpox    Localized swelling of left lower leg 04/13/2023   Multiple sclerosis    Osteopenia    right hip   Osteoporosis    left hip   Rosacea    Seizure (HCC)    12 yrs ago, on keppra   Swelling of hand joint, right 08/05/2021    Family History  Adopted: Yes  Problem Relation Age of Onset   Breast cancer Neg Hx    Past Surgical History:  Procedure Laterality Date   CATARACT EXTRACTION W/PHACO Right 09/04/2023   Procedure: CATARACT EXTRACTION PHACO AND INTRAOCULAR LENS PLACEMENT (IOC) RIGHT 6.89 00:35.0;  Surgeon: Jaye Fallow, MD;  Location[facility];  Service: Ophthalmology;  Laterality: Right;   CATARACT EXTRACTION W/PHACO Left 09/18/2023   Procedure: CATARACT EXTRACTION PHACO AND INTRAOCULAR LENS PLACEMENT (IOC) LEFT 5.62 00:38.4;  Surgeon: Jaye Fallow, MD;  Location: [facility];  Service: Ophthalmology;  Laterality: Left;   COLONOSCOPY     COLONOSCOPY WITH PROPOFOL  N/A 05/27/2020   Procedure: COLONOSCOPY WITH PROPOFOL ;  Surgeon: Therisa Bi, MD;  Location: [facility];  Service: Gastroenterology;  Laterality: N/A;   COLONOSCOPY WITH PROPOFOL  N/A 06/04/2023   Procedure: COLONOSCOPY WITH PROPOFOL ;  Surgeon: Unk Corinn Skiff, MD;  Location: [facility];  Service: Gastroenterology;  Laterality: N/A;   GASTROCNEMIUS RECESSION Left 12/24/2019   Social History   Tobacco Use   Smoking status: Never    Passive exposure: Current   Smokeless tobacco: Never  Vaping Use   Vaping status: Never Used  Substance Use Topics   Alcohol use: Yes    Alcohol/week: 4.0  standard drinks of alcohol    Types: 4 Glasses of wine per week   Drug use: Never   Social History   Social History Narrative   Not on file     Immunization History  Administered Date(s) Administered   Influenza,inj,Quad PF,6+ Mos 05/01/2018, 05/06/2019, 05/06/2020, 05/20/2021   Influenza-Unspecified 06/18/2013, 05/12/2016   PFIZER(Purple Top)SARS-COV-2 Vaccination 10/10/2019, 11/04/2019, 06/18/2020   PNEUMOCOCCAL CONJUGATE-20 10/12/2023   Tdap 09/21/2010, 10/10/2022     Objective: Vital Signs: BP (!) 143/69 (BP Location: Right Arm, Patient Position: Sitting, Cuff Size: Normal)   Pulse 62   Temp 97.7 F (36.5 C)   Ht 5' 3 (1.6 m)   Wt 129 lb (58.5 kg)   BMI 22.85 kg/m    Physical Exam Vitals and nursing note reviewed.  Constitutional:      Appearance: She is well-developed.  HENT:     Head: Normocephalic and atraumatic.  Eyes:     Conjunctiva/sclera: Conjunctivae normal.  Cardiovascular:     Rate and Rhythm: Normal rate and regular rhythm.     Heart  sounds: Normal heart sounds.  Pulmonary:     Effort: Pulmonary effort is normal.     Breath sounds: Normal breath sounds.  Abdominal:     General: Bowel sounds are normal.     Palpations: Abdomen is soft.  Musculoskeletal:     Cervical back: Normal range of motion.  Skin:    General: Skin is warm and dry.     Capillary Refill: Capillary refill takes 2 to 3 seconds.     Comments: No sclerodactyly or nailbed capillary changes were noted.  Neurological:     Mental Status: She is alert and oriented to person, place, and time.  Psychiatric:        Behavior: Behavior normal.      Musculoskeletal Exam: Cervical,, thoracic and lumbar spine were in good range of motion.  She was able to reach the floor with the palm of her hands.  Shoulders, elbows, wrist joints were in good range of motion.  She had ulnar deviation in both hands.  CDAI Exam: CDAI Score: 13  Patient Global: 0 / 100; Provider Global: 30 /  100 Swollen: 5 ; Tender: 5  Joint Exam 06/30/2024      Right  Left  MCP 1     Swollen Tender  MCP 2  Swollen Tender  Swollen Tender  MCP 3  Swollen Tender  Swollen Tender     Investigation: No additional findings.  Imaging: XR Foot 2 Views Right Result Date: 06/30/2024 PIP and DIP narrowing was noted.  Fifth metatarsal head irregularity and cystic changes were noted which could represent previous trauma.  Dorsal spurring was noted.  No tibiotalar or subtalar joint space narrowing was noted. 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  XR Foot 2 Views Left Result Date: 06/30/2024 PIP and DIP narrowing was noted.  No MTP narrowing was noted.  Metatarsal tarsal narrowing with dorsal spurring was noted.  No tibiotalar or subtalar joint space narrowing was noted. 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  XR Hand 2 View Left Result Date: 06/30/2024 Juxta-articular osteopenia was noted.  CMC, PIP and DIP narrowing was noted.  No MCP, intercarpal or radiocarpal joint space narrowing was noted. Impression: These findings suggestive of inflammatory arthritis and osteoarthritis overlap.  XR Hand 2 View Right Result Date: 06/30/2024 Juxta-articular osteopenia was noted.  CMC, PIP and DIP narrowing was noted.  Third MCP narrowing with cystic changes in the metacarpal head was noted.  No significant intercarpal or radiocarpal joint space narrowing was noted.  Cystic changes were noted in the carpal bones. Impression: These findings are suggestive of inflammatory arthritis and osteoarthritis overlap.   Recent Labs: Lab Results  Component Value Date   WBC 5.6 04/18/2023   HGB 13.5 04/18/2023   PLT 361.0 04/18/2023   NA 141 10/12/2023   K 5.1 10/12/2023   CL 104 10/12/2023   CO2 30 10/12/2023   GLUCOSE 91 10/12/2023   BUN 15 10/12/2023   CREATININE 0.52 10/12/2023   BILITOT 0.5 10/12/2023   ALKPHOS 60 10/12/2023   AST 18  10/12/2023   ALT 18 10/12/2023   PROT 6.8 10/12/2023   ALBUMIN 4.7 10/12/2023   CALCIUM  9.6 10/12/2023   October 12, 2023 LDL 123 December 21, 2023 ANA 1: 320 NS, dsDNA negative, Smith negative, sed rate 3 Speciality Comments: No specialty comments available.  Procedures:  No procedures performed Allergies: Patient has no active allergies.   Assessment / Plan:  Visit Diagnoses: Positive ANA (antinuclear antibody) -patient was found to have positive ANA while she was getting workup for facial rash which was diagnosed as rosacea.  Double-stranded ENA and Claudene was negative.  Today on the examination she had livedo reticularis and decreased capillary refill.  No nailbed capillary changes or sclerodactyly was noted.  She gives history of Raynaud's phenomenon and dry eyes.  She denies any joint pain.  Synovitis was noted on the examination over several of her MCP joints.  Ulnar deviation was noted.  I we will obtain additional labs today.  Plan: Protein / creatinine ratio, urine, CBC with Differential/Platelet, Comprehensive metabolic panel with GFR, Sedimentation rate, ANA, Anti-scleroderma antibody, RNP Antibody, Anti-Smith antibody, Sjogrens syndrome-A extractable nuclear antibody, Sjogrens syndrome-B extractable nuclear antibody, Anti-DNA antibody, double-stranded, C3 and C4, Beta-2  glycoprotein antibodies, Cardiolipin antibodies, IgG, IgM, IgA, Glucose 6 phosphate dehydrogenase  Chronic inflammatory arthritis -patient denies any pain and discomfort in her joints.  Synovitis was noted over MCP joints.  Ulnar deviation was also noted.  Plan: Rheumatoid factor, Cyclic citrul peptide antibody, IgG  Raynaud's disease without gangrene -keeping core temperature warm and warm clothing was discussed.  Plan: Beta-2  glycoprotein antibodies, Cardiolipin antibodies, IgG, IgM, IgA  Pain in both hands -she denies any discomfort in her hands.  Synovitis was noted over MCP joints bilaterally.  Plan: XR Hand 2 View  Right, XR Hand 2 View Left.  X-rays with history of osteoarthritis and inflammatory arthritis.  Third MCP narrowing was noted.  Cystic changes were noted in the third metacarpal head and in carpal bones.  Juxta-articular osteopenia was noted bilaterally.  Pain in both feet -bilateral pes cavus and dorsal spurs were noted.  She also had hammertoes.  Plan: XR Foot 2 Views Right, XR Foot 2 Views Left.  X-ray with history of osteoarthritis.  Fifth metatarsal head changes suggestive of previous trauma were noted.  Pes cavus of both feet-orthotics were advised.  Rash - Diagnosed as rosacea and miliaria by dermatology. Followed [facility].  The facial rash involves nasolabial folds and responded to doxycycline .  The findings are suggestive of rosacea.  She had good response to treatment given by dermatology.  History of colonic polyps  Other hyperlipidemia  Age-related osteoporosis with current pathological fracture, sequela - DEXA scan 06/2023.On Evenity since 09/2023. Ttd by [employer] in [place].  Stress fracture, left tibia, sequela-patient developed left tibia stress fracture while she was in [place].  She was started on the treatment for osteoporosis after DEXA scan.  Vitamin D deficiency-she has been taking vitamin D.  Multiple sclerosis - followed by [facility] neurology.Dxd 09/10/1995.  Seizure disorder (HCC) - Seizure 15 years ago.  She is on Keppra.  Orders: Orders Placed This Encounter  Procedures   XR Foot 2 Views Right   XR Foot 2 Views Left   XR Hand 2 View Right   XR Hand 2 View Left   Protein / creatinine ratio, urine   CBC with Differential/Platelet   Comprehensive metabolic panel with GFR   Sedimentation rate   Rheumatoid factor   Cyclic citrul peptide antibody, IgG   ANA   Anti-scleroderma antibody   RNP Antibody   Anti-Smith antibody   Sjogrens syndrome-A extractable nuclear antibody   Sjogrens syndrome-B extractable nuclear antibody   Anti-DNA antibody,  double-stranded   C3 and C4   Beta-2  glycoprotein antibodies   Cardiolipin antibodies, IgG, IgM, IgA   Glucose 6 phosphate dehydrogenase   No orders of the defined types were placed in  this encounter.   Follow-Up Instructions: Return for Positive ANA, inflammatory arthritis.   Maya Nash, MD  Note - This record has been created using Animal nutritionist.  Chart creation errors have been sought, but may not always  have been located. Such creation errors do not reflect on  the standard of medical care.

## 2024-06-30 ENCOUNTER — Ambulatory Visit: Attending: Rheumatology | Admitting: Rheumatology

## 2024-06-30 ENCOUNTER — Ambulatory Visit

## 2024-06-30 ENCOUNTER — Encounter: Payer: Self-pay | Admitting: Rheumatology

## 2024-06-30 VITALS — BP 143/69 | HR 62 | Temp 97.7°F | Ht 63.0 in | Wt 129.0 lb

## 2024-06-30 DIAGNOSIS — E559 Vitamin D deficiency, unspecified: Secondary | ICD-10-CM | POA: Diagnosis not present

## 2024-06-30 DIAGNOSIS — E7849 Other hyperlipidemia: Secondary | ICD-10-CM | POA: Insufficient documentation

## 2024-06-30 DIAGNOSIS — R7689 Other specified abnormal immunological findings in serum: Secondary | ICD-10-CM | POA: Diagnosis not present

## 2024-06-30 DIAGNOSIS — Z860100 Personal history of colon polyps, unspecified: Secondary | ICD-10-CM | POA: Insufficient documentation

## 2024-06-30 DIAGNOSIS — M84362S Stress fracture, left tibia, sequela: Secondary | ICD-10-CM | POA: Insufficient documentation

## 2024-06-30 DIAGNOSIS — G35D Multiple sclerosis, unspecified: Secondary | ICD-10-CM

## 2024-06-30 DIAGNOSIS — M79672 Pain in left foot: Secondary | ICD-10-CM

## 2024-06-30 DIAGNOSIS — G8929 Other chronic pain: Secondary | ICD-10-CM

## 2024-06-30 DIAGNOSIS — M79671 Pain in right foot: Secondary | ICD-10-CM | POA: Insufficient documentation

## 2024-06-30 DIAGNOSIS — R7989 Other specified abnormal findings of blood chemistry: Secondary | ICD-10-CM

## 2024-06-30 DIAGNOSIS — I7300 Raynaud's syndrome without gangrene: Secondary | ICD-10-CM | POA: Insufficient documentation

## 2024-06-30 DIAGNOSIS — M8000XS Age-related osteoporosis with current pathological fracture, unspecified site, sequela: Secondary | ICD-10-CM

## 2024-06-30 DIAGNOSIS — M79641 Pain in right hand: Secondary | ICD-10-CM | POA: Diagnosis not present

## 2024-06-30 DIAGNOSIS — Q6671 Congenital pes cavus, right foot: Secondary | ICD-10-CM | POA: Insufficient documentation

## 2024-06-30 DIAGNOSIS — R21 Rash and other nonspecific skin eruption: Secondary | ICD-10-CM | POA: Diagnosis present

## 2024-06-30 DIAGNOSIS — Q6672 Congenital pes cavus, left foot: Secondary | ICD-10-CM

## 2024-06-30 DIAGNOSIS — M2550 Pain in unspecified joint: Secondary | ICD-10-CM

## 2024-06-30 DIAGNOSIS — M1380 Other specified arthritis, unspecified site: Secondary | ICD-10-CM | POA: Diagnosis not present

## 2024-06-30 DIAGNOSIS — M5416 Radiculopathy, lumbar region: Secondary | ICD-10-CM

## 2024-06-30 DIAGNOSIS — M79642 Pain in left hand: Secondary | ICD-10-CM

## 2024-06-30 DIAGNOSIS — G40909 Epilepsy, unspecified, not intractable, without status epilepticus: Secondary | ICD-10-CM | POA: Diagnosis present

## 2024-06-30 NOTE — Patient Instructions (Signed)
 Raynaud's Phenomenon  Raynaud's phenomenon is a condition that affects the blood vessels (arteries) that carry blood to the fingers and toes. The arteries that supply blood to the ears, lips, nipples, or the tip of the nose might also be affected. Raynaud's phenomenon causes the arteries to become narrow temporarily (spasm). As a result, the flow of blood to the affected areas is temporarily decreased. This usually occurs in response to cold temperatures or stress. During an attack, the skin in the affected areas turns white, then blue, and finally red. A person may also feel tingling or numbness in those areas. Attacks usually last for only a brief period, and then the blood flow to the area returns to normal. In most cases, Raynaud's phenomenon does not cause serious health problems. What are the causes? In many cases, the cause of this condition is not known. The condition may occur on its own (primary Raynaud's phenomenon) or may be associated with other diseases or factors (secondary Raynaud's phenomenon). Possible causes may include: Diseases or medical conditions that damage the arteries. Injuries and repetitive actions that hurt the hands or feet. Being exposed to certain chemicals. Taking medicines that narrow the arteries. Other medical conditions, such as lupus, scleroderma, rheumatoid arthritis, thyroid problems, blood disorders, Sjogren syndrome, or atherosclerosis. What increases the risk? The following factors may make you more likely to develop this condition: Being 18-78 years old. Being female. Having a family history of Raynaud's phenomenon. Living in a cold climate. Smoking. What are the signs or symptoms? Symptoms of this condition usually occur when you are exposed to cold temperatures or when you have emotional stress. The symptoms may last for a few minutes or up to several hours. They usually affect your fingers but may also affect your toes, nipples, lips, ears, or the  tip of your nose. Symptoms may include: Changes in skin color. The skin in the affected areas will turn pale or white. The skin may then change from white to bluish to red as normal blood flow returns to the area. Numbness, tingling, or pain in the affected areas. In severe cases, symptoms may include: Skin sores. Tissues decaying and dying (gangrene). How is this diagnosed? This condition may be diagnosed based on: Your symptoms and medical history. A physical exam. During the exam, you may be asked to put your hands in cold water to check for a reaction to cold temperature. Tests, such as: Blood tests to check for other diseases or conditions. A test to check the movement of blood through your arteries and veins (vascular ultrasound). A test in which the skin at the base of your fingernail is examined under a microscope (nailfold capillaroscopy). How is this treated? During an episode, you can take actions to help symptoms go away faster. Options include moving your arms around in a windmill pattern, warming your fingers under warm water, or placing your fingers in a warm body fold, such as your armpit. Long-term treatment for this condition often involves making lifestyle changes and taking steps to control your exposure to cold temperature. For more severe cases, medicine (calcium channel blockers) may be used to improve blood circulation. Follow these instructions at home: Avoiding cold temperatures Take these steps to avoid exposure to cold: If possible, stay indoors during cold weather. When you go outside during cold weather, dress in layers and wear mittens, a hat, a scarf, and warm footwear. Wear mittens or gloves when handling ice or frozen food. Use holders for glasses or cans containing  cold drinks. Let warm water run for a while before taking a shower or bath. Warm up the car before driving in cold weather. Lifestyle If possible, avoid stressful and emotional situations. Try  to find ways to manage your stress, such as: Exercise. Yoga. Meditation. Biofeedback. Do not use any products that contain nicotine or tobacco. These products include cigarettes, chewing tobacco, and vaping devices, such as e-cigarettes. If you need help quitting, ask your health care provider. Avoid secondhand smoke. Limit your use of caffeine. Switch to decaffeinated coffee, tea, and soda. Avoid chocolate. Avoid vibrating tools and machinery. General instructions Protect your hands and feet from injuries, cuts, or bruises. Avoid wearing tight rings or wristbands. Wear loose fitting socks and comfortable, roomy shoes. Take over-the-counter and prescription medicines only as told by your health care provider. Where to find support Raynaud's Association: www.raynauds.org Where to find more information [employer] of Arthritis and Musculoskeletal and Skin Diseases: www.niams.http://www.myers.net/ Contact a health care provider if: Your discomfort becomes worse despite lifestyle changes. You develop sores on your fingers or toes that do not heal. You have breaks in the skin on your fingers or toes. You have a fever. You have pain or swelling in your joints. You have a rash. Your symptoms occur on only one side of your body. Get help right away if: Your fingers or toes turn black. You have severe pain in the affected areas. These symptoms may represent a serious problem that is an emergency. Do not wait to see if the symptoms will go away. Get medical help right away. Call your local emergency services (911 in the [place] Do not drive yourself to the hospital. Summary Raynaud's phenomenon is a condition that affects the arteries that carry blood to the fingers, toes, ears, lips, nipples, or the tip of the nose. In many cases, the cause of this condition is not known. Symptoms of this condition include changes in skin color along with numbness and tingling in the affected area. Treatment for  this condition includes lifestyle changes and reducing exposure to cold temperatures. Medicines may be used for severe cases of the condition. Contact your health care provider if your condition worsens despite treatment. This information is not intended to replace advice given to you by your health care provider. Make sure you discuss any questions you have with your health care provider. Document Revised: 09/07/2020 Document Reviewed: 09/07/2020 Elsevier Patient Education  2024 [employer].

## 2024-07-02 ENCOUNTER — Ambulatory Visit: Payer: Self-pay | Admitting: Rheumatology

## 2024-07-02 NOTE — Progress Notes (Signed)
 CBC and CMP normal, ANA positive, ENA panel negative, anticardiolipin negative, beta-2  GP 1 negative, urine protein creatinine ratio normal, RF negative, anti-CCP negative, sed rate normal, complements normal.  G6PD pending.  I will discuss results at the follow-up visit.

## 2024-07-03 LAB — BETA-2 GLYCOPROTEIN ANTIBODIES
Beta-2 Glyco 1 IgA: <2 U/mL (ref <20.0)
Beta-2 Glyco 1 IgM: <2 U/mL (ref <20.0)
Beta-2 Glyco I IgG: <2 U/mL (ref <20.0)

## 2024-07-03 LAB — CBC WITH DIFFERENTIAL/PLATELET
Absolute Lymphocytes: 1259 {cells}/uL (ref 850–3900)
Absolute Monocytes: 258 {cells}/uL (ref 200–950)
Basophils Absolute: 49 {cells}/uL (ref 0–200)
Basophils Relative: 1.2 %
Eosinophils Absolute: 78 {cells}/uL (ref 15–500)
Eosinophils Relative: 1.9 %
HCT: 44.7 % (ref 35.9–46.0)
Hemoglobin: 14.6 g/dL (ref 11.7–15.5)
MCH: 31 pg (ref 27.0–33.0)
MCHC: 32.7 g/dL (ref 31.6–35.4)
MCV: 94.9 fL (ref 81.4–101.7)
MPV: 9.5 fL (ref 7.5–12.5)
Monocytes Relative: 6.3 %
Neutro Abs: 2456 {cells}/uL (ref 1500–7800)
Neutrophils Relative %: 59.9 %
Platelets: 275 Thousand/uL (ref 140–400)
RBC: 4.71 Million/uL (ref 3.80–5.10)
RDW: 12.7 % (ref 11.0–15.0)
Total Lymphocyte: 30.7 %
WBC: 4.1 Thousand/uL (ref 3.8–10.8)

## 2024-07-03 LAB — CARDIOLIPIN ANTIBODIES, IGG, IGM, IGA
Anticardiolipin IgA: <2 [APL'U]/mL (ref <20.0)
Anticardiolipin IgG: <2 [GPL'U]/mL (ref <20.0)
Anticardiolipin IgM: <2 [MPL'U]/mL (ref <20.0)

## 2024-07-03 LAB — COMPREHENSIVE METABOLIC PANEL WITH GFR
AG Ratio: 2.4 (calc) (ref 1.0–2.5)
ALT: 22 U/L (ref 6–29)
AST: 20 U/L (ref 10–35)
Albumin: 5.1 g/dL (ref 3.6–5.1)
Alkaline phosphatase (APISO): 63 U/L (ref 37–153)
BUN: 12 mg/dL (ref 7–25)
CO2: 29 mmol/L (ref 20–32)
Calcium: 9.5 mg/dL (ref 8.6–10.4)
Chloride: 104 mmol/L (ref 98–110)
Creat: 0.56 mg/dL (ref 0.50–1.05)
Globulin: 2.1 g/dL (ref 1.9–3.7)
Glucose, Bld: 94 mg/dL (ref 65–99)
Potassium: 4.9 mmol/L (ref 3.5–5.3)
Sodium: 142 mmol/L (ref 135–146)
Total Bilirubin: 0.7 mg/dL (ref 0.2–1.2)
Total Protein: 7.2 g/dL (ref 6.1–8.1)
eGFR: 101 mL/min/1.73m2 (ref >=60)

## 2024-07-03 LAB — SJOGRENS SYNDROME-B EXTRACTABLE NUCLEAR ANTIBODY: SSB (La) (ENA) Antibody, IgG: <1 AI

## 2024-07-03 LAB — RNP ANTIBODY: Ribonucleic Protein(ENA) Antibody, IgG: <1 AI

## 2024-07-03 LAB — GLUCOSE 6 PHOSPHATE DEHYDROGENASE: G-6PDH: 15.7 U/g{Hb} (ref 7.0–20.5)

## 2024-07-03 LAB — ANTI-SMITH ANTIBODY: ENA SM Ab Ser-aCnc: <1 AI

## 2024-07-03 LAB — ANTI-DNA ANTIBODY, DOUBLE-STRANDED: ds DNA Ab: <1 [IU]/mL

## 2024-07-03 LAB — PROTEIN / CREATININE RATIO, URINE
Creatinine, Urine: 9 mg/dL — ABNORMAL LOW (ref 20–275)
Total Protein, Urine: <4 mg/dL — ABNORMAL LOW (ref 5–24)

## 2024-07-03 LAB — RHEUMATOID FACTOR: Rheumatoid fact SerPl-aCnc: <10 [IU]/mL (ref <14)

## 2024-07-03 LAB — ANTI-NUCLEAR AB-TITER (ANA TITER): ANA Titer 1: 1:320 {titer} — ABNORMAL HIGH

## 2024-07-03 LAB — SJOGRENS SYNDROME-A EXTRACTABLE NUCLEAR ANTIBODY: SSA (Ro) (ENA) Antibody, IgG: <1 AI

## 2024-07-03 LAB — CYCLIC CITRUL PEPTIDE ANTIBODY, IGG: Cyclic Citrullin Peptide Ab: <16 U

## 2024-07-03 LAB — C3 AND C4
C3 Complement: 105 mg/dL (ref 83–193)
C4 Complement: 25 mg/dL (ref 15–57)

## 2024-07-03 LAB — ANA: Anti Nuclear Antibody (ANA): POSITIVE — AB

## 2024-07-03 LAB — SEDIMENTATION RATE: Sed Rate: 2 mm/h (ref 0–30)

## 2024-07-03 LAB — ANTI-SCLERODERMA ANTIBODY: Scleroderma (Scl-70) (ENA) Antibody, IgG: <1 AI

## 2024-07-03 NOTE — Progress Notes (Signed)
G6PD normal

## 2024-07-09 NOTE — Progress Notes (Signed)
 "  Office Visit Note  Patient: Natasha George             Date of Birth: 10-14-1955           MRN: [id number]             PCP: Gretta Comer POUR, NP Referring: Gretta Comer POUR, NP Visit Date: 07/23/2024 Occupation: Data Unavailable  Subjective:  Pain and swelling in hands  History of Present Illness: Natasha George is a 68 y.o. female with chronic inflammatory arthritis.  She returns today after her initial visit on June 30, 2024.  She continues to have pain and swelling in her both hands.  None of the other joints are painful.  She denies any history of oral ulcers, nasal ulcers, malar rash, photosensitivity, lymphadenopathy.  She continues to have mild Raynaud's symptoms.  She manages by warm clothing.  She has been on Evenity for osteoporosis at [facility].    Activities of Daily Living:  Patient reports morning stiffness for 0 minutes.   Patient Denies nocturnal pain.  Difficulty dressing/grooming: Denies Difficulty climbing stairs: Denies Difficulty getting out of chair: Denies Difficulty using hands for taps, buttons, cutlery, and/or writing: Denies  Review of Systems  Constitutional:  Negative for fatigue.  HENT:  Negative for mouth sores and mouth dryness.   Eyes:  Positive for dryness.  Respiratory:  Negative for shortness of breath.   Cardiovascular:  Negative for chest pain and palpitations.  Gastrointestinal:  Negative for blood in stool, constipation and diarrhea.  Endocrine: Negative for increased urination.  Genitourinary:  Negative for involuntary urination.  Musculoskeletal:  Positive for gait problem. Negative for joint pain, joint pain, joint swelling, myalgias, muscle weakness, morning stiffness, muscle tenderness and myalgias.  Skin:  Negative for color change, rash, hair loss and sensitivity to sunlight.  Allergic/Immunologic: Negative for susceptible to infections.  Neurological:  Negative for dizziness and headaches.  Hematological:   Negative for swollen glands.  Psychiatric/Behavioral:  Negative for depressed mood and sleep disturbance. The patient is not nervous/anxious.     PMFS History:  Patient Active Problem List   Diagnosis Date Noted   Welcome to Medicare preventive visit 10/12/2023   Osteoporosis 07/20/2023   History of colonic polyps 06/04/2023   Polyp of transverse colon 06/04/2023   Immunosuppression 09/20/2021   Polyarthralgia 08/05/2021   Facial rash 05/24/2021   Hyperkalemia 06/04/2020   Abnormal gait 03/25/2020   Muscle contracture 01/08/2020   Hyperlipidemia 05/06/2019   Elevated LFTs 05/06/2019   Lumbar radiculopathy 11/01/2018   Chronic left hip pain 05/01/2018   Preventative health care 05/01/2018   Vitamin D deficiency 04/05/2018   Multiple sclerosis 02/21/2018   Seizure disorder (HCC) 02/21/2018    Past Medical History:  Diagnosis Date   Acute pain of left knee 04/13/2023   Chickenpox    Localized swelling of left lower leg 04/13/2023   Multiple sclerosis    Osteopenia    right hip   Osteoporosis    left hip   Rosacea    Seizure (HCC)    12 yrs ago, on keppra   Swelling of hand joint, right 08/05/2021    Family History  Adopted: Yes  Problem Relation Age of Onset   Breast cancer Neg Hx    Past Surgical History:  Procedure Laterality Date   CATARACT EXTRACTION W/PHACO Right 09/04/2023   Procedure: CATARACT EXTRACTION PHACO AND INTRAOCULAR LENS PLACEMENT (IOC) RIGHT 6.89 00:35.0;  Surgeon: Jaye Fallow, MD;  Location: [facility];  Service: Ophthalmology;  Laterality: Right;   CATARACT EXTRACTION W/PHACO Left 09/18/2023   Procedure: CATARACT EXTRACTION PHACO AND INTRAOCULAR LENS PLACEMENT (IOC) LEFT 5.62 00:38.4;  Surgeon: Jaye Fallow, MD;  Location: [facility];  Service: Ophthalmology;  Laterality: Left;   COLONOSCOPY     COLONOSCOPY WITH PROPOFOL  N/A 05/27/2020   Procedure: COLONOSCOPY WITH PROPOFOL ;  Surgeon: Therisa Bi, MD;  Location: [facility]  ENDOSCOPY;  Service: Gastroenterology;  Laterality: N/A;   COLONOSCOPY WITH PROPOFOL  N/A 06/04/2023   Procedure: COLONOSCOPY WITH PROPOFOL ;  Surgeon: Unk Corinn Skiff, MD;  Location: [facility] END[facility]COPY;  Service: Gastroenterology;  Laterality: N/A;   GASTROCNEMIUS RECESSION Left 12/24/2019   Social History[1] Social History   Social History Narrative   Not on file     Immunization History  Administered Date(s) Administered   Influenza,inj,Quad PF,6+ Mos 05/01/2018, 05/06/2019, 05/06/2020, 05/20/2021   Influenza-Unspecified 06/18/2013, 05/12/2016   PFIZER(Purple Top)SARS-COV-2 Vaccination 10/10/2019, 11/04/2019, 06/18/2020   PNEUMOCOCCAL CONJUGATE-20 10/12/2023   Tdap 09/21/2010, 10/10/2022     Objective: Vital Signs: BP 131/80   Pulse 67   Temp 97.9 F (36.6 C)   Resp 14   Ht 5' 3 (1.6 m)   Wt 128 lb 9.6 oz (58.3 kg)   BMI 22.78 kg/m    Physical Exam Vitals and nursing note reviewed.  Constitutional:      Appearance: She is well-developed.  HENT:     Head: Normocephalic and atraumatic.  Eyes:     Conjunctiva/sclera: Conjunctivae normal.  Cardiovascular:     Rate and Rhythm: Normal rate and regular rhythm.     Heart sounds: Normal heart sounds.  Pulmonary:     Effort: Pulmonary effort is normal.     Breath sounds: Normal breath sounds.  Abdominal:     General: Bowel sounds are normal.     Palpations: Abdomen is soft.  Musculoskeletal:     Cervical back: Normal range of motion.  Lymphadenopathy:     Cervical: No cervical adenopathy.  Skin:    General: Skin is warm and dry.     Capillary Refill: Capillary refill takes less than 2 seconds.  Neurological:     Mental Status: She is alert and oriented to person, place, and time.  Psychiatric:        Behavior: Behavior normal.      Musculoskeletal Exam: Cervical, thoracic and lumbar spine were in good range of motion.  There was no SI joint tenderness.  Shoulder joints, elbow joints, wrist joints, MCPs, PIPs  and DIPs were in good range of motion with no synovitis.  Ulnar deviation was noted and bilateral MCP joints.  Synovial thickening and synovitis was noted over MCP joints as described.  Hip joints and knee joints were in good range of motion without any warmth swelling or effusion.  There was no tenderness over ankles or MTPs.   CDAI Exam: CDAI Score: 11  Patient Global: 20 / 100; Provider Global: 30 / 100 Swollen: 3 ; Tender: 3  Joint Exam 07/23/2024      Right  Left  MCP 2     Swollen Tender  MCP 3  Swollen Tender  Swollen Tender     Investigation: No additional findings.  Imaging: XR Foot 2 Views Right Result Date: 06/30/2024 PIP and DIP narrowing was noted.  Fifth metatarsal head irregularity and cystic changes were noted which could represent previous trauma.  Dorsal spurring was noted.  No tibiotalar or subtalar joint space narrowing was noted. 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  XR Foot 2 Views Left Result Date: 06/30/2024 PIP and DIP narrowing was noted.  No MTP narrowing was noted.  Metatarsal tarsal narrowing with dorsal spurring was noted.  No tibiotalar or subtalar joint space narrowing was noted. 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  XR Hand 2 View Left Result Date: 06/30/2024 Juxta-articular osteopenia was noted.  CMC, PIP and DIP narrowing was noted.  No MCP, intercarpal or radiocarpal joint space narrowing was noted. Impression: These findings suggestive of inflammatory arthritis and osteoarthritis overlap.  XR Hand 2 View Right Result Date: 06/30/2024 Juxta-articular osteopenia was noted.  CMC, PIP and DIP narrowing was noted.  Third MCP narrowing with cystic changes in the metacarpal head was noted.  No significant intercarpal or radiocarpal joint space narrowing was noted.  Cystic changes were noted in the carpal bones. Impression: These findings are suggestive of inflammatory arthritis and  osteoarthritis overlap.   Recent Labs: Lab Results  Component Value Date   WBC 4.1 06/30/2024   HGB 14.6 06/30/2024   PLT 275 06/30/2024   NA 142 06/30/2024   K 4.9 06/30/2024   CL 104 06/30/2024   CO2 29 06/30/2024   GLUCOSE 94 06/30/2024   BUN 12 06/30/2024   CREATININE 0.56 06/30/2024   BILITOT 0.7 06/30/2024   ALKPHOS 60 10/12/2023   AST 20 06/30/2024   ALT 22 06/30/2024   PROT 7.2 06/30/2024   ALBUMIN 4.7 10/12/2023   CALCIUM  9.5 06/30/2024   June 30, 2024 urine protein creatinine ratio normal, ANA 1: 320 NS, ENA (SCL 70, RNP, Smith, SSA, SSB, dsDNA) negative, C3-C4 normal, anticardiolipin negative, beta-2  GP 1 negative, sed rate 2, RF negative, anti-CCP negative, G6PD normal  Speciality Comments: No specialty comments available.  Procedures:  No procedures performed Allergies: Patient has no active allergies.   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  She continues to have some stiffness and discomfort in her hands.  Labs and x-rays were reviewed with the patient.  X-rays showed right third MCP narrowing and cystic changes in the carpal bones.  She also juxta-articular osteopenia.  Detailed counsel regarding rheumatoid arthritis was provided.  Different treatment options and their side effects were discussed.  I discussed the option of starting on hydroxychloroquine .  A handout was given and consent was taken.  Side effects of hydroxychloroquine  were reviewed at length.  Patient states she had a baseline EKG last year which was normal.  Her dose will be 200 mg p.o. twice daily Monday to Friday based on her weight.  Patient was counseled on the purpose, proper use, and adverse effects of hydroxychloroquine  including nausea/diarrhea, skin rash, headaches, and sun sensitivity.  Advised patient to wear sunscreen once starting  hydroxychloroquine  to reduce risk of rash associated with sun sensitivity.  Discussed importance of annual eye exams while on hydroxychloroquine  to monitor to ocular toxicity and discussed importance of frequent laboratory monitoring.  Provided patient with eye exam form for baseline ophthalmologic exam.  Provided patient with educational materials on hydroxychloroquine  and answered all questions.  Patient consented to hydroxychloroquine . Will upload consent in the media tab.    Reviewed risk for QTC prolongation when used in combination with other QTc prolonging agents (including but not limited to antiarrhythmics, macrolide antibiotics, flouroquinolone antibiotics, haloperidol, quetiapine, olanzapine, risperidone, droperidol, ziprasidone, amitriptyline, citalopram, ondansetron, migraine triptans, and methadone).   Positive ANA (antinuclear  antibody) - ANA positive, ENA panel negative, complements normal, sed rate normal, synovitis, ulnar deviation, Raynauds, facial rash, dry eyes, livedo reticularis: Labs were reviewed with the patient.  High risk medication use-patient will be starting hydroxychloroquine  200 mg p.o. twice daily Monday to Friday.  She was advised to get a baseline eye examination and then annual eye examination to monitor for ocular toxicity.  Patient had a baseline EKG with her PCP last year which was normal per patient.  We requested a copy of EKG.  Information regarding immunization was placed in the AVS.  Raynaud's disease without gangrene - Beta-2  GP 1, anticardiolipin antibodies negative.  Patient states her symptoms are manageable with keeping core temperature warm or wearing warm clothing.  She will good capillary refill with no sclerodactyly.  Pain in both hands - Synovitis was noted over MCP joints.  X-rays showed right third MCP narrowing.  Pain in both feet - X-rays obtained at the last visit were suggestive of osteoarthritis.  Fifth metatarsal head changes were suggestive  of previous trauma.  X-ray findings were reviewed with the patient.  Patient has been wearing orthotics now with arch support which has been helpful.  Pes cavus of both feet - Bilateral pes cavus and dorsal spurs were noted.  Age-related osteoporosis with current pathological fracture, sequela - DEXA scan was in December 2024.  She has been on Evenity since March 2025 by [facility] in [place].  Patient states her DEXA has improved.  She will be switching to IV Reclast after finishing the Evenity.  Vitamin D deficiency  Stress fracture, left tibia, sequela  Rash - Diagnosed as miliaria and rosacea by dermatology.  Other hyperlipidemia  Multiple sclerosis - Followed by [facility] neurology.  She was diagnosed in February 1997.  Seizure disorder (HCC) - Last seizure 15 years ago.  Her symptoms are controlled on Keppra.  History of colonic polyps  Orders: No orders of the defined types were placed in this encounter.  Meds ordered this encounter  Medications   hydroxychloroquine  (PLAQUENIL ) 200 MG tablet    Sig: Take 200mg  by mouth twice daily, Monday through Friday only. None on Saturday or Sunday.    Dispense:  40 tablet    Refill:  0    Face-to-face time spent with patient was over 45 minutes. Greater than 50% of time was spent in counseling and coordination of care.  Follow-Up Instructions: Return in about 3 months (around 10/21/2024) for Rheumatoid arthritis.   Maya Nash, MD  Note - This record has been created using Animal nutritionist.  Chart creation errors have been sought, but may not always  have been located. Such creation errors do not reflect on  the standard of medical care.     [1]  Social History Tobacco Use   Smoking status: Never    Passive exposure: Current   Smokeless tobacco: Never  Vaping Use   Vaping status: Never Used  Substance Use Topics   Alcohol use: Yes    Alcohol/week: 4.0 standard drinks of alcohol    Types: 4 Glasses of wine per week     Comment: occ   Drug use: Never   "

## 2024-07-23 ENCOUNTER — Ambulatory Visit: Attending: Rheumatology | Admitting: Rheumatology

## 2024-07-23 ENCOUNTER — Encounter: Payer: Self-pay | Admitting: Rheumatology

## 2024-07-23 VITALS — BP 131/80 | HR 67 | Temp 97.9°F | Resp 14 | Ht 63.0 in | Wt 128.6 lb

## 2024-07-23 DIAGNOSIS — M84362S Stress fracture, left tibia, sequela: Secondary | ICD-10-CM | POA: Insufficient documentation

## 2024-07-23 DIAGNOSIS — Q6671 Congenital pes cavus, right foot: Secondary | ICD-10-CM | POA: Insufficient documentation

## 2024-07-23 DIAGNOSIS — G35D Multiple sclerosis, unspecified: Secondary | ICD-10-CM | POA: Insufficient documentation

## 2024-07-23 DIAGNOSIS — M79672 Pain in left foot: Secondary | ICD-10-CM | POA: Insufficient documentation

## 2024-07-23 DIAGNOSIS — M79641 Pain in right hand: Secondary | ICD-10-CM | POA: Diagnosis present

## 2024-07-23 DIAGNOSIS — I7300 Raynaud's syndrome without gangrene: Secondary | ICD-10-CM | POA: Insufficient documentation

## 2024-07-23 DIAGNOSIS — E559 Vitamin D deficiency, unspecified: Secondary | ICD-10-CM | POA: Diagnosis present

## 2024-07-23 DIAGNOSIS — G40909 Epilepsy, unspecified, not intractable, without status epilepticus: Secondary | ICD-10-CM | POA: Insufficient documentation

## 2024-07-23 DIAGNOSIS — M8000XS Age-related osteoporosis with current pathological fracture, unspecified site, sequela: Secondary | ICD-10-CM | POA: Insufficient documentation

## 2024-07-23 DIAGNOSIS — Z79899 Other long term (current) drug therapy: Secondary | ICD-10-CM | POA: Insufficient documentation

## 2024-07-23 DIAGNOSIS — M79671 Pain in right foot: Secondary | ICD-10-CM | POA: Diagnosis present

## 2024-07-23 DIAGNOSIS — M1380 Other specified arthritis, unspecified site: Secondary | ICD-10-CM

## 2024-07-23 DIAGNOSIS — M79642 Pain in left hand: Secondary | ICD-10-CM | POA: Diagnosis present

## 2024-07-23 DIAGNOSIS — R7689 Other specified abnormal immunological findings in serum: Secondary | ICD-10-CM | POA: Insufficient documentation

## 2024-07-23 DIAGNOSIS — Z860100 Personal history of colon polyps, unspecified: Secondary | ICD-10-CM | POA: Diagnosis present

## 2024-07-23 DIAGNOSIS — Q6672 Congenital pes cavus, left foot: Secondary | ICD-10-CM | POA: Insufficient documentation

## 2024-07-23 DIAGNOSIS — M0600 Rheumatoid arthritis without rheumatoid factor, unspecified site: Secondary | ICD-10-CM | POA: Insufficient documentation

## 2024-07-23 DIAGNOSIS — E7849 Other hyperlipidemia: Secondary | ICD-10-CM | POA: Diagnosis present

## 2024-07-23 DIAGNOSIS — R21 Rash and other nonspecific skin eruption: Secondary | ICD-10-CM | POA: Insufficient documentation

## 2024-07-23 LAB — OPHTHALMOLOGY REPORT-SCANNED: A Comment: NORMAL

## 2024-07-23 MED ORDER — HYDROXYCHLOROQUINE SULFATE 200 MG PO TABS: ORAL_TABLET | ORAL | 0 refills | Status: AC

## 2024-07-23 NOTE — Patient Instructions (Addendum)
 Standing Labs We placed an order today for your standing lab work.   Please have your standing labs drawn in 1 month after starting plaquenil , 3 months and then every 5 months.   Please have your labs drawn 2 weeks prior to your appointment so that the provider can discuss your lab results at your appointment, if possible.  Please note that you may see your imaging and lab results in MyChart before we have reviewed them. We will contact you once all results are reviewed. Please allow our office up to 72 hours to thoroughly review all of the results before contacting the office for clarification of your results.  WALK-IN LAB HOURS  Monday through Thursday from 8:00 am - 4:30 pm and Friday from 8:00 am-12:00 pm.  Patients with office visits requiring labs will be seen before walk-in labs.  You may encounter longer than normal wait times. Please allow additional time. Wait times may be shorter on  Monday and Thursday afternoons.  We do not book appointments for walk-in labs. We appreciate your patience and understanding with our staff.   Labs are drawn by [facility]. Please bring your co-pay at the time of your lab draw.  You may receive a bill from [facility] for your lab work.  Please note if you are on Hydroxychloroquine  and and an order has been placed for a Hydroxychloroquine  level,  you will need to have it drawn 4 hours or more after your last dose.  If you wish to have your labs drawn at another location, please call the office 24 hours in advance so we can fax the orders.  The office is located at [address], [place], [place] [address]   If you have any questions regarding directions or hours of operation,  please call [phone].   As a reminder, please drink plenty of water  prior to coming for your lab work. Thanks!    Hydroxychloroquine  Tablets What is this medication? HYDROXYCHLOROQUINE  (hye drox ee KLOR oh kwin) treats autoimmune conditions, such as rheumatoid  arthritis and lupus. It works by slowing down an overactive immune system. It may also be used to prevent and treat malaria. It works by killing the parasite that causes malaria. It belongs to a group of medications called DMARDs. This medicine may be used for other purposes; ask your health care provider or pharmacist if you have questions. COMMON BRAND NAME(S): Plaquenil , Quineprox, SOVUNA  What should I tell my care team before I take this medication? They need to know if you have any of these conditions: Diabetes Eye disease, vision problems Frequently drink alcohol G6PD deficiency Heart disease Irregular heartbeat or rhythm Kidney disease Liver disease Porphyria Psoriasis An unusual or allergic reaction to hydroxychloroquine , other medications, foods, dyes, or preservatives Pregnant or trying to get pregnant Breastfeeding How should I use this medication? Take this medication by mouth with water . Take it as directed on the prescription label. Do not cut, crush, or chew this medication. Swallow the tablets whole. Take it with food. Do not take it more than directed. Take all of this medication unless your care team tells you to stop it early. Keep taking it even if you think you are better. Take products with antacids in them at a different time of day than this medication. Take this medication 4 hours before or 4 hours after antacids. Talk to your care team if you have questions. Talk to your care team about the use of this medication in children. While this medication  may be prescribed for selected conditions, precautions do apply. Overdosage: If you think you have taken too much of this medicine contact a poison control center or emergency room at once. NOTE: This medicine is only for you. Do not share this medicine with others. What if I miss a dose? If you miss a dose, take it as soon as you can. If it is almost time for your next dose, take only that dose. Do not take double or  extra doses. What may interact with this medication? Do not take this medication with any of the following: Cisapride Dronedarone Pimozide Thioridazine This medication may also interact with the following: Ampicillin Antacids Cimetidine Cyclosporine Digoxin Kaolin Medications for diabetes, such as insulin, glipizide, glyburide Medications for seizures, such as carbamazepine, phenobarbital, phenytoin Mefloquine Methotrexate Other medications that cause heart rhythm changes Praziquantel This list may not describe all possible interactions. Give your health care provider a list of all the medicines, herbs, non-prescription drugs, or dietary supplements you use. Also tell them if you smoke, drink alcohol, or use illegal drugs. Some items may interact with your medicine. What should I watch for while using this medication? Visit your care team for regular checks on your progress. Tell your care team if your symptoms do not start to get better or if they get worse. You may need blood work done while you are taking this medication. If you take other medications that can affect heart rhythm, you may need more testing. Talk to your care team if you have questions. Your vision may be tested before and during use of this medication. Tell your care team right away if you have any change in your eyesight. This medication may cause serious skin reactions. They can happen weeks to months after starting the medication. Contact your care team right away if you notice fevers or flu-like symptoms with a rash. The rash may be red or purple and then turn into blisters or peeling of the skin. Or, you might notice a red rash with swelling of the face, lips or lymph nodes in your neck or under your arms. If you or your family notice any changes in your behavior, such as new or worsening depression, thoughts of harming yourself, anxiety, or other unusual or disturbing thoughts, or memory loss, call your care team  right away. What side effects may I notice from receiving this medication? Side effects that you should report to your care team as soon as possible: Allergic reactions--skin rash, itching, hives, swelling of the face, lips, tongue, or throat Aplastic anemia--unusual weakness or fatigue, dizziness, headache, trouble breathing, increased bleeding or bruising Change in vision Heart rhythm changes--fast or irregular heartbeat, dizziness, feeling faint or lightheaded, chest pain, trouble breathing Infection--fever, chills, cough, or sore throat Low blood sugar (hypoglycemia)--tremors or shaking, anxiety, sweating, cold or clammy skin, confusion, dizziness, rapid heartbeat Muscle injury--unusual weakness or fatigue, muscle pain, dark yellow or brown urine, decrease in amount of urine Pain, tingling, or numbness in the hands or feet Rash, fever, and swollen lymph nodes Redness, blistering, peeling, or loosening of the skin, including inside the mouth Thoughts of suicide or self-harm, worsening mood, or feelings of depression Unusual bruising or bleeding Side effects that usually do not require medical attention (report to your care team if they continue or are bothersome): Diarrhea Headache Nausea Stomach pain Vomiting This list may not describe all possible side effects. Call your doctor for medical advice about side effects. You may report side effects to FDA  at 1-800-FDA-1088. Where should I keep my medication? Keep out of the reach of children and pets. Store at room temperature up to 30 degrees C (86 degrees F). Protect from light. Get rid of any unused medication after the expiration date. To get rid of medications that are no longer needed or have expired: Take the medication to a medication take-back program. Check with your pharmacy or law enforcement to find a location. If you cannot return the medication, check the label or package insert to see if the medication should be thrown out  in the garbage or flushed down the toilet. If you are not sure, ask your care team. If it is safe to put it in the trash, empty the medication out of the container. Mix the medication with cat litter, dirt, coffee grounds, or other unwanted substance. Seal the mixture in a bag or container. Put it in the trash. NOTE: This sheet is a summary. It may not cover all possible information. If you have questions about this medicine, talk to your doctor, pharmacist, or health care provider.  2024 Elsevier/Gold Standard (2022-01-09 00:00:00)  Rheumatoid Arthritis Rheumatoid arthritis (RA) is a long-term (chronic) disease that causes inflammation in the joints. RA may start slowly. It most often affects the small joints of the hands and feet. Usually, the same joints are affected on both sides of the body. Inflammation from RA can also affect other parts of the body, including the heart, eyes, or lungs. There is no cure for RA, but medicines can help your symptoms and stop or slow down the progression of the disease. What are the causes? RA is an autoimmune disease. When you have an autoimmune disease, your body's defense system (immune system) mistakenly attacks healthy body tissues. The exact cause of RA is not known. What increases the risk? The following factors may make you more likely to develop this condition: Being female. Having a family history of RA or other autoimmune diseases. Having a history of smoking. Being obese. Having been exposed to pollutants or chemicals. What are the signs or symptoms? Symptoms of this condition usually start gradually. They are often worse in the morning. The first symptom may be morning stiffness that lasts longer than 30 minutes. As RA progresses, symptoms may include: Pain, stiffness, swelling, warmth, and tenderness in joints on both sides of your body. Loss of energy. Loss of appetite. Weight loss. Low-grade fever. Dry eyes and dry mouth. Firm lumps  (rheumatoid nodules) that grow beneath your skin in areas such as your forearm bones near your elbows and on your hands. Changes in the appearance of joints (deformity) and loss of joint function. Symptoms of this condition vary from person to person. Symptoms of RA often come and go. Sometimes, symptoms get worse for a period of time. These are called flares. How is this diagnosed? This condition is diagnosed based on your symptoms, medical history, and a physical exam. You may have X-rays or an MRI to check for the type of joint changes that are caused by RA. You may also have blood tests to look for: Proteins (antibodies) that your immune system may make if you have RA. These include rheumatoid factor (RF) and anti-CCP. When blood tests show these proteins, you are said to have seropositive RA. When blood tests do not show these proteins, you may have seronegative RA. Inflammation in your blood. A low number of red blood cells (anemia). How is this treated? The goals of treatment are to relieve pain,  reduce inflammation, and slow down or stop joint damage and disability. Treatment may include: Lifestyle changes. It is important to rest as needed, eat a healthy diet, and exercise. Medicines. Your health care provider may adjust your medicines every 3 months until treatment goals are reached. Common medicines include: Pain relievers (analgesics). Corticosteroids and NSAIDs, such as ibuprofen, to reduce inflammation. Disease-modifying antirheumatic drugs (DMARDs) to try to slow the course of the disease. Biologic response modifiers to reduce inflammation and damage. Physical therapy and occupational therapy. Surgery, if you have severe joint damage. Joint replacement or fusing of joints may be needed. Your health care provider will work with you to identify the best treatment option for you based on assessment of the overall disease activity in your body. Follow these instructions at  home: Managing pain, stiffness, and swelling If directed, apply heat to the affected area as often as told by your health care provider. Use the heat source that your health care provider recommends, such as a moist heat pack or a heating pad. Place a towel between your skin and the heat source. Leave the heat on for 20-30 minutes. Remove the heat if your skin turns bright red. This is especially important if you are unable to feel pain, heat, or cold. You have a greater risk of getting burned.  Activity Return to your normal activities as told by your health care provider. Ask your health care provider what activities are safe for you. Rest when you are having a flare. Start an exercise program as told by your health care provider. This may include physical therapy exercises to maintain movement and strength in your joints. General instructions Take over-the-counter and prescription medicines only as told by your health care provider. Keep all follow-up visits. This is important. Where to find more information [employer]: rheumatology.org Arthritis Foundation: arthritis.org Contact a health care provider if: You have a flare-up of RA symptoms. You have a fever. You have side effects from your medicines. Get help right away if: You have chest pain. You have trouble breathing. You quickly develop a hot, painful joint that is more severe than your usual joint aches. These symptoms may be an emergency. Get help right away. Call 911. Do not wait to see if the symptoms will go away. Do not drive yourself to the hospital. Summary Rheumatoid arthritis (RA) is a long-term (chronic) disease that causes inflammation in the joints. RA is an autoimmune disease. The goals of treatment are to relieve pain, reduce inflammation, and slow down or stop joint damage and disability. This information is not intended to replace advice given to you by your health care provider. Make  sure you discuss any questions you have with your health care provider. Document Revised: 05/05/2021 Document Reviewed: 05/05/2021 Elsevier Patient Education  2024 Elsevier Inc.  Hydroxychloroquine  Tablets What is this medication? HYDROXYCHLOROQUINE  (hye drox ee KLOR oh kwin) treats autoimmune conditions, such as rheumatoid arthritis and lupus. It works by slowing down an overactive immune system. It may also be used to prevent and treat malaria. It works by killing the parasite that causes malaria. It belongs to a group of medications called DMARDs. This medicine may be used for other purposes; ask your health care provider or pharmacist if you have questions. COMMON BRAND NAME(S): Plaquenil , Quineprox, SOVUNA  What should I tell my care team before I take this medication? They need to know if you have any of these conditions: Diabetes Eye disease, vision problems Frequently drink alcohol G6PD  deficiency Heart disease Irregular heartbeat or rhythm Kidney disease Liver disease Porphyria Psoriasis An unusual or allergic reaction to hydroxychloroquine , other medications, foods, dyes, or preservatives Pregnant or trying to get pregnant Breastfeeding How should I use this medication? Take this medication by mouth with water . Take it as directed on the prescription label. Do not cut, crush, or chew this medication. Swallow the tablets whole. Take it with food. Do not take it more than directed. Take all of this medication unless your care team tells you to stop it early. Keep taking it even if you think you are better. Take products with antacids in them at a different time of day than this medication. Take this medication 4 hours before or 4 hours after antacids. Talk to your care team if you have questions. Talk to your care team about the use of this medication in children. While this medication may be prescribed for selected conditions, precautions do apply. Overdosage: If you think you  have taken too much of this medicine contact a poison control center or emergency room at once. NOTE: This medicine is only for you. Do not share this medicine with others. What if I miss a dose? If you miss a dose, take it as soon as you can. If it is almost time for your next dose, take only that dose. Do not take double or extra doses. What may interact with this medication? Do not take this medication with any of the following: Cisapride Dronedarone Pimozide Thioridazine This medication may also interact with the following: Ampicillin Antacids Cimetidine Cyclosporine Digoxin Kaolin Medications for diabetes, such as insulin, glipizide, glyburide Medications for seizures, such as carbamazepine, phenobarbital, phenytoin Mefloquine Methotrexate Other medications that cause heart rhythm changes Praziquantel This list may not describe all possible interactions. Give your health care provider a list of all the medicines, herbs, non-prescription drugs, or dietary supplements you use. Also tell them if you smoke, drink alcohol, or use illegal drugs. Some items may interact with your medicine. What should I watch for while using this medication? Visit your care team for regular checks on your progress. Tell your care team if your symptoms do not start to get better or if they get worse. You may need blood work done while you are taking this medication. If you take other medications that can affect heart rhythm, you may need more testing. Talk to your care team if you have questions. Your vision may be tested before and during use of this medication. Tell your care team right away if you have any change in your eyesight. This medication may cause serious skin reactions. They can happen weeks to months after starting the medication. Contact your care team right away if you notice fevers or flu-like symptoms with a rash. The rash may be red or purple and then turn into blisters or peeling of the  skin. Or, you might notice a red rash with swelling of the face, lips or lymph nodes in your neck or under your arms. If you or your family notice any changes in your behavior, such as new or worsening depression, thoughts of harming yourself, anxiety, or other unusual or disturbing thoughts, or memory loss, call your care team right away. What side effects may I notice from receiving this medication? Side effects that you should report to your care team as soon as possible: Allergic reactions--skin rash, itching, hives, swelling of the face, lips, tongue, or throat Aplastic anemia--unusual weakness or fatigue, dizziness, headache, trouble breathing, increased  bleeding or bruising Change in vision Heart rhythm changes--fast or irregular heartbeat, dizziness, feeling faint or lightheaded, chest pain, trouble breathing Infection--fever, chills, cough, or sore throat Low blood sugar (hypoglycemia)--tremors or shaking, anxiety, sweating, cold or clammy skin, confusion, dizziness, rapid heartbeat Muscle injury--unusual weakness or fatigue, muscle pain, dark yellow or brown urine, decrease in amount of urine Pain, tingling, or numbness in the hands or feet Rash, fever, and swollen lymph nodes Redness, blistering, peeling, or loosening of the skin, including inside the mouth Thoughts of suicide or self-harm, worsening mood, or feelings of depression Unusual bruising or bleeding Side effects that usually do not require medical attention (report to your care team if they continue or are bothersome): Diarrhea Headache Nausea Stomach pain Vomiting This list may not describe all possible side effects. Call your doctor for medical advice about side effects. You may report side effects to FDA at 1-800-FDA-1088. Where should I keep my medication? Keep out of the reach of children and pets. Store at room temperature up to 30 degrees C (86 degrees F). Protect from light. Get rid of any unused medication  after the expiration date. To get rid of medications that are no longer needed or have expired: Take the medication to a medication take-back program. Check with your pharmacy or law enforcement to find a location. If you cannot return the medication, check the label or package insert to see if the medication should be thrown out in the garbage or flushed down the toilet. If you are not sure, ask your care team. If it is safe to put it in the trash, empty the medication out of the container. Mix the medication with cat litter, dirt, coffee grounds, or other unwanted substance. Seal the mixture in a bag or container. Put it in the trash. NOTE: This sheet is a summary. It may not cover all possible information. If you have questions about this medicine, talk to your doctor, pharmacist, or health care provider.  2024 Elsevier/Gold Standard (2022-01-09 00:00:00)  Vaccines You are taking a medication(s) that can suppress your immune system.  The following immunizations are recommended: Flu annually Covid-19  RSV Td/Tdap (tetanus, diphtheria, pertussis) every 10 years Pneumonia (Prevnar 15 then Pneumovax 23 at least 1 year apart.  Alternatively, can take Prevnar 20 without needing additional dose) Shingrix: 2 doses from 4 weeks to 6 months apart  Please check with your PCP to make sure you are up to date.

## 2024-07-24 NOTE — Telephone Encounter (Signed)
 We need to send patient's last ECG to Dr. Dolphus, thanks!

## 2024-10-14 ENCOUNTER — Encounter: Admitting: Primary Care

## 2024-10-23 ENCOUNTER — Ambulatory Visit: Admitting: Rheumatology

## 2024-11-05 ENCOUNTER — Ambulatory Visit: Admitting: Physician Assistant
